# Patient Record
Sex: Male | Born: 1985 | Race: White | Hispanic: No | Marital: Single | Smoking: Current every day smoker
Health system: Southern US, Community
[De-identification: ages and names within clinical notes are randomized; demographics above are authoritative.]

## PROBLEM LIST (undated history)

## (undated) DIAGNOSIS — K4090 Unilateral inguinal hernia, without obstruction or gangrene, not specified as recurrent: Secondary | ICD-10-CM

## (undated) HISTORY — PX: OTHER SURGICAL HISTORY: SHX169

---

## 2010-10-02 ENCOUNTER — Emergency Department: Payer: Self-pay | Admitting: Emergency Medicine

## 2011-01-07 ENCOUNTER — Emergency Department: Payer: Self-pay | Admitting: Emergency Medicine

## 2011-05-17 ENCOUNTER — Emergency Department: Payer: Self-pay | Admitting: Unknown Physician Specialty

## 2011-10-24 ENCOUNTER — Emergency Department: Payer: Self-pay | Admitting: Emergency Medicine

## 2012-05-08 ENCOUNTER — Emergency Department: Payer: Self-pay | Admitting: Emergency Medicine

## 2013-06-24 ENCOUNTER — Emergency Department: Payer: Self-pay | Admitting: Emergency Medicine

## 2014-05-20 ENCOUNTER — Emergency Department: Payer: Self-pay | Admitting: Internal Medicine

## 2014-09-03 ENCOUNTER — Emergency Department: Payer: Self-pay | Admitting: Emergency Medicine

## 2014-10-18 ENCOUNTER — Emergency Department: Admit: 2014-10-18 | Disposition: A | Discharge status: Home / Self Care | Payer: Self-pay | Admitting: Student

## 2014-10-18 LAB — URINALYSIS, COMPLETE
BACTERIA: NONE SEEN
BILIRUBIN, UR: NEGATIVE
Blood: NEGATIVE
GLUCOSE, UR: NEGATIVE mg/dL (ref 0–75)
Ketone: NEGATIVE
Leukocyte Esterase: NEGATIVE
Nitrite: NEGATIVE
Ph: 7 (ref 4.5–8.0)
Protein: NEGATIVE
RBC,UR: NONE SEEN /HPF (ref 0–5)
SPECIFIC GRAVITY: 1.009 (ref 1.003–1.030)
Squamous Epithelial: NONE SEEN

## 2014-10-18 LAB — COMPREHENSIVE METABOLIC PANEL
ALBUMIN: 4.4 g/dL
ANION GAP: 7 (ref 7–16)
Alkaline Phosphatase: 67 U/L
BUN: 15 mg/dL
Bilirubin,Total: 0.4 mg/dL
CHLORIDE: 104 mmol/L
CO2: 26 mmol/L
Calcium, Total: 9.4 mg/dL
Creatinine: 0.92 mg/dL
EGFR (Non-African Amer.): >60
Glucose: 98 mg/dL
POTASSIUM: 3.7 mmol/L
SGOT(AST): 20 U/L
SGPT (ALT): 25 U/L
Sodium: 137 mmol/L
Total Protein: 7.4 g/dL

## 2014-10-18 LAB — CBC WITH DIFFERENTIAL/PLATELET
Basophil #: 0.1 10*3/uL (ref 0.0–0.1)
Basophil %: 1 %
EOS PCT: 2.2 %
Eosinophil #: 0.1 10*3/uL (ref 0.0–0.7)
HCT: 44.6 % (ref 40.0–52.0)
HGB: 15 g/dL (ref 13.0–18.0)
LYMPHS PCT: 24.6 %
Lymphocyte #: 1.5 10*3/uL (ref 1.0–3.6)
MCH: 29.3 pg (ref 26.0–34.0)
MCHC: 33.7 g/dL (ref 32.0–36.0)
MCV: 87 fL (ref 80–100)
MONOS PCT: 9.2 %
Monocyte #: 0.6 x10 3/mm (ref 0.2–1.0)
NEUTROS ABS: 3.8 10*3/uL (ref 1.4–6.5)
Neutrophil %: 63 %
Platelet: 278 10*3/uL (ref 150–440)
RBC: 5.12 10*6/uL (ref 4.40–5.90)
RDW: 13.8 % (ref 11.5–14.5)
WBC: 6.1 10*3/uL (ref 3.8–10.6)

## 2014-10-18 LAB — LIPASE, BLOOD: Lipase: 36 U/L

## 2015-10-23 ENCOUNTER — Emergency Department
Admission: EM | Admit: 2015-10-23 | Discharge: 2015-10-23 | Disposition: A | Discharge status: Home / Self Care | Payer: Self-pay | Attending: Emergency Medicine | Admitting: Emergency Medicine

## 2015-10-23 ENCOUNTER — Encounter: Payer: Self-pay | Admitting: Emergency Medicine

## 2015-10-23 DIAGNOSIS — F17210 Nicotine dependence, cigarettes, uncomplicated: Secondary | ICD-10-CM | POA: Insufficient documentation

## 2015-10-23 DIAGNOSIS — J040 Acute laryngitis: Secondary | ICD-10-CM | POA: Insufficient documentation

## 2015-10-23 DIAGNOSIS — J9801 Acute bronchospasm: Secondary | ICD-10-CM | POA: Insufficient documentation

## 2015-10-23 MED ORDER — BENZONATATE 100 MG PO CAPS: 100.0000 mg | ORAL_CAPSULE | Freq: Three times a day (TID) | ORAL | Status: DC | PRN

## 2015-10-23 MED ORDER — DEXAMETHASONE SODIUM PHOSPHATE 10 MG/ML IJ SOLN
10.0000 mg | Freq: Once | INTRAMUSCULAR | Status: AC
  Administered 2015-10-23: 10 mg via INTRAMUSCULAR
  Filled 2015-10-23: qty 1

## 2015-10-23 MED ORDER — FLUTICASONE PROPIONATE 50 MCG/ACT NA SUSP: 2.0000 | Freq: Every day | NASAL | Status: DC

## 2015-10-23 NOTE — ED Provider Notes (Signed)
[facility] Emergency Department Provider Note ____________________________________________  Time seen: 1615  I have reviewed the triage vital signs and the nursing notes.  HISTORY  Chief Complaint  Cough  HPI Patrick Zamora is a 30 y.o. male presents to the ED for evaluation of a 4 day complaint of dry, nonproductive cough. He also reports some weakness to his voice as well as raspiness. He denies outright fevers, chills, sweats. He has dosed over-the-counter Benadryl and an over-the-counter cough medicine with limited benefit. He reports some burning to the anterior chest related to the cough. He denies any other symptoms at this time.  History reviewed. No pertinent past medical history.  There are no active problems to display for this patient.  History reviewed. No pertinent past surgical history.  Current Outpatient Rx  Name  Route  Sig  Dispense  Refill  . benzonatate (TESSALON PERLES) 100 MG capsule   Oral   Take 1 capsule (100 mg total) by mouth 3 (three) times daily as needed for cough (Take 1-2 per dose).   30 capsule   0   . fluticasone (FLONASE) 50 MCG/ACT nasal spray   Each Nare   Place 2 sprays into both nostrils daily.   16 g   0    Allergies Review of patient's allergies indicates no known allergies.  No family history on file.  Social History Social History  Substance Use Topics  . Smoking status: Current Every Day Smoker -- 0.50 packs/day    Types: Cigarettes  . Smokeless tobacco: Never Used  . Alcohol Use: No   Review of Systems  Constitutional: Negative for fever. Eyes: Negative for visual changes. ENT: Negative for sore throat. Hoarseness as above Cardiovascular: Negative for chest pain. Respiratory: Negative for shortness of breath. Nonproductive cough as above Gastrointestinal: Negative for abdominal pain, vomiting and diarrhea. Musculoskeletal: Negative for back pain. Skin: Negative for rash. Neurological:  Negative for headaches, focal weakness or numbness. ____________________________________________  PHYSICAL EXAM:  VITAL SIGNS: ED Triage Vitals  Enc Vitals Group     BP 10/23/15 1523 135/91 mmHg     Pulse Rate 10/23/15 1523 81     Resp 10/23/15 1523 16     Temp 10/23/15 1523 97.7 F (36.5 C)     Temp Source 10/23/15 1523 Oral     SpO2 10/23/15 1523 96 %     Weight 10/23/15 1523 180 lb (81.647 kg)     Height 10/23/15 1523  (1.702 m)     Head Cir --      Peak Flow --      Pain Score 10/23/15 1524 0     Pain Loc --      Pain Edu? --      Excl. in GC? --    Constitutional: Alert and oriented. Well appearing and in no distress. Head: Normocephalic and atraumatic.      Eyes: Conjunctivae are normal. PERRL. Normal extraocular movements      Ears: Canals clear. TMs intact bilaterally.   Nose: No congestion/rhinorrhea. Dry nasal mucosa without epistaxis.   Mouth/Throat: Mucous membranes are moist. Uvula is midline and tonsils are flat.   Neck: Supple. No thyromegaly. Hematological/Lymphatic/Immunological: No cervical lymphadenopathy. Cardiovascular: Normal rate, regular rhythm.  Respiratory: Normal respiratory effort. No wheezes/rales/rhonchi. Musculoskeletal: Nontender with normal range of motion in all extremities.  Neurologic:  Normal gait without ataxia. Normal speech and language. No gross focal neurologic deficits are appreciated. Skin:  Skin is warm, dry and intact. No rash  noted. __________________________________________  PROCEDURES  Decadron 10 mg IM ____________________________________________  INITIAL IMPRESSION / ASSESSMENT AND PLAN / ED COURSE  Patient with an acute laryngitis and bronchospasm likely secondary to allergies. He'll be discharged with a prescription for Flonase and Tessalon Perles. He is encouraged to continue to dose over-the-counter allergy medicines as needed. Is also advised to utilize nasal saline to moisturize his sinuses. He'll  follow up with his primary care provider or [facility] as needed for ongoing symptoms. ____________________________________________  FINAL CLINICAL IMPRESSION(S) / ED DIAGNOSES  Final diagnoses:  Laryngitis, acute  Bronchospasm, acute      Lissa HoardJenise V Bacon Phillip Maffei, PA-C 10/23/15 1724  Arnaldo NatalPaul F Malinda, MD 10/24/15 [phone]

## 2015-10-23 NOTE — Discharge Instructions (Signed)
Bronchospasm, Adult  A bronchospasm is a spasm or tightening of the airways going into the lungs. During a bronchospasm breathing becomes more difficult because the airways get smaller. When this happens there can be coughing, a whistling sound when breathing (wheezing), and difficulty breathing. Bronchospasm is often associated with asthma, but not all patients who experience a bronchospasm have asthma.  CAUSES   A bronchospasm is caused by inflammation or irritation of the airways. The inflammation or irritation may be triggered by:   · Allergies (such as to animals, pollen, food, or mold). Allergens that cause bronchospasm may cause wheezing immediately after exposure or many hours later.    · Infection. Viral infections are believed to be the most common cause of bronchospasm.    · Exercise.    · Irritants (such as pollution, cigarette smoke, strong odors, aerosol sprays, and paint fumes).    · Weather changes. Winds increase molds and pollens in the air. Rain refreshes the air by washing irritants out. Cold air may cause inflammation.    · Stress and emotional upset.    SIGNS AND SYMPTOMS   · Wheezing.    · Excessive nighttime coughing.    · Frequent or severe coughing with a simple cold.    · Chest tightness.    · Shortness of breath.    DIAGNOSIS   Bronchospasm is usually diagnosed through a history and physical exam. Tests, such as chest X-rays, are sometimes done to look for other conditions.  TREATMENT   · Inhaled medicines can be given to open up your airways and help you breathe. The medicines can be given using either an inhaler or a nebulizer machine.  · Corticosteroid medicines may be given for severe bronchospasm, usually when it is associated with asthma.  HOME CARE INSTRUCTIONS   · Always have a plan prepared for seeking medical care. Know when to call your health care provider and local emergency services (911 in the [place] Know where you can access local emergency care.  · Only take medicines as  directed by your health care provider.  · If you were prescribed an inhaler or nebulizer machine, ask your health care provider to explain how to use it correctly. Always use a spacer with your inhaler if you were given one.  · It is necessary to remain calm during an attack. Try to relax and breathe more slowly.   · Control your home environment in the following ways:      Change your heating and air conditioning filter at least once a month.      Limit your use of fireplaces and wood stoves.    Do not smoke and do not allow smoking in your home.      Avoid exposure to perfumes and fragrances.      Get rid of pests (such as roaches and mice) and their droppings.      Throw away plants if you see mold on them.      Keep your house clean and dust free.      Replace carpet with wood, tile, or vinyl flooring. Carpet can trap dander and dust.      Use allergy-proof pillows, mattress covers, and box spring covers.      Wash bed sheets and blankets every week in hot water and dry them in a dryer.      Use blankets that are made of polyester or cotton.      Wash hands frequently.  SEEK MEDICAL CARE IF:   · You have muscle aches.    · You have chest pain.    · The sputum changes from clear or   even after taking your prescribed medicines.   You have increased difficulty breathing.   You develop severe chest pain. MAKE SURE YOU:   Understand these instructions.  Will watch your condition.  Will get help right away if you are not doing well or get worse.   This information is not intended to replace advice given to you by your health care provider. Make sure you discuss any questions you have with your health care  provider.   Document Released: 06/28/2003 Document Revised: 07/16/2014 Document Reviewed: 12/15/2012 Elsevier Interactive Patient Education 2016 Elsevier Inc.  Laryngitis Laryngitis is swelling (inflammation) of your vocal cords. This causes hoarseness, coughing, loss of voice, sore throat, or a dry throat. When your vocal cords are inflamed, your voice sounds different. Laryngitis can be temporary (acute) or long-term (chronic). Most cases of acute laryngitis improve with time. Chronic laryngitis is laryngitis that lasts for more than three weeks. HOME CARE  Drink enough fluid to keep your pee (urine) clear or pale yellow.  Breathe in moist air. Use a humidifier if you live in a dry climate.  Take medicines only as told by your doctor.  Do not smoke cigarettes or electronic cigarettes. If you need help quitting, ask your doctor.  Talk as little as possible. Also avoid whispering, which can cause vocal strain.  Write instead of talking. Do this until your voice is back to normal. GET HELP IF:  You have a fever.  Your pain is worse.  You have trouble swallowing. GET HELP RIGHT AWAY IF:  You cough up blood.  You have trouble breathing.   This information is not intended to replace advice given to you by your health care provider. Make sure you discuss any questions you have with your health care provider.   Document Released: 06/14/2011 Document Revised: 07/16/2014 Document Reviewed: 12/08/2013 Elsevier Interactive Patient Education 2016 [employer]  Take the prescription meds as directed. Start a daily allergy medicine (Allegra, Claritin, Zyrtec). Use nasal saline to moisturize the sinuses. [facility] for ongoing symptoms management.

## 2015-10-23 NOTE — ED Notes (Signed)
C/O cough x 4 days.  Non productive cough, symptoms worsening.

## 2015-10-24 ENCOUNTER — Emergency Department: Payer: Self-pay

## 2015-10-24 ENCOUNTER — Encounter: Payer: Self-pay | Admitting: Emergency Medicine

## 2015-10-24 ENCOUNTER — Emergency Department
Admission: EM | Admit: 2015-10-24 | Discharge: 2015-10-24 | Disposition: A | Discharge status: Home / Self Care | Payer: Self-pay | Attending: Emergency Medicine | Admitting: Emergency Medicine

## 2015-10-24 DIAGNOSIS — J40 Bronchitis, not specified as acute or chronic: Secondary | ICD-10-CM | POA: Insufficient documentation

## 2015-10-24 DIAGNOSIS — R55 Syncope and collapse: Secondary | ICD-10-CM | POA: Insufficient documentation

## 2015-10-24 DIAGNOSIS — F17210 Nicotine dependence, cigarettes, uncomplicated: Secondary | ICD-10-CM | POA: Insufficient documentation

## 2015-10-24 LAB — BASIC METABOLIC PANEL
Anion gap: 10 (ref 5–15)
BUN: 17 mg/dL (ref 6–20)
CALCIUM: 8.9 mg/dL (ref 8.9–10.3)
CHLORIDE: 106 mmol/L (ref 101–111)
CO2: 20 mmol/L — ABNORMAL LOW (ref 22–32)
CREATININE: 0.91 mg/dL (ref 0.61–1.24)
GFR calc Af Amer: >60 mL/min (ref >60)
GFR calc non Af Amer: >60 mL/min (ref >60)
Glucose, Bld: 137 mg/dL — ABNORMAL HIGH (ref 65–99)
Potassium: 3.6 mmol/L (ref 3.5–5.1)
SODIUM: 136 mmol/L (ref 135–145)

## 2015-10-24 LAB — CBC WITH DIFFERENTIAL/PLATELET
Basophils Absolute: 0.1 10*3/uL (ref 0–0.1)
Basophils Relative: 0 %
Eosinophils Absolute: 0.2 10*3/uL (ref 0–0.7)
Eosinophils Relative: 1 %
HCT: 38.9 % — ABNORMAL LOW (ref 40.0–52.0)
Hemoglobin: 13.4 g/dL (ref 13.0–18.0)
LYMPHS ABS: 1.1 10*3/uL (ref 1.0–3.6)
Lymphocytes Relative: 5 %
MCH: 30.3 pg (ref 26.0–34.0)
MCHC: 34.4 g/dL (ref 32.0–36.0)
MCV: 88.1 fL (ref 80.0–100.0)
MONO ABS: 1.4 10*3/uL — AB (ref 0.2–1.0)
Monocytes Relative: 6 %
Neutro Abs: 20.9 10*3/uL — ABNORMAL HIGH (ref 1.4–6.5)
Neutrophils Relative %: 88 %
Platelets: 270 10*3/uL (ref 150–440)
RBC: 4.41 MIL/uL (ref 4.40–5.90)
RDW: 13.5 % (ref 11.5–14.5)
WBC: 23.7 10*3/uL — ABNORMAL HIGH (ref 3.8–10.6)

## 2015-10-24 LAB — TROPONIN I

## 2015-10-24 LAB — FIBRIN DERIVATIVES D-DIMER (ARMC ONLY): Fibrin derivatives D-dimer (ARMC): 190 (ref 0–499)

## 2015-10-24 MED ORDER — HYDROCOD POLST-CPM POLST ER 10-8 MG/5ML PO SUER
5.0000 mL | Freq: Once | ORAL | Status: AC
  Administered 2015-10-24: 5 mL via ORAL
  Filled 2015-10-24: qty 5

## 2015-10-24 MED ORDER — GUAIFENESIN-CODEINE 100-10 MG/5ML PO SOLN: 5.0000 mL | Freq: Four times a day (QID) | ORAL | Status: DC | PRN

## 2015-10-24 MED ORDER — AZITHROMYCIN 250 MG PO TABS: ORAL_TABLET | ORAL | Status: AC

## 2015-10-24 NOTE — ED Provider Notes (Signed)
[facility] Emergency Department Provider Note  Time seen: 3:24 PM  I have reviewed the triage vital signs and the nursing notes.   HISTORY  Chief Complaint Gastroesophageal Reflux and Chest Pain    HPI Patrick Zamora is a 30 y.o. male with no past medical history presents to the emergency department with cough. Patient states for the past 4 days he has had a very frequent cough associated with mild anterior chest burning sensation. Patient was seen in the emergency department yesterday for the same and diagnosed with bronchitis prescribed Tessalon Perles and Flonase, patient states no relief.Patient states today he was having a coughing spell and had a near syncopal episode during the coughing spell so he came to the emergency department for evaluation. States the chest pain is only when he coughs, it is largely unchanged, however he does have mild chest pain with deep inspiration. Denies any leg pain or swelling. Denies any history of DVT. Describes the chest pain is mild burning sensation. Denies fever, nasal congestion, or history of seasonal allergies.     History reviewed. No pertinent past medical history.  There are no active problems to display for this patient.   History reviewed. No pertinent past surgical history.  Current Outpatient Rx  Name  Route  Sig  Dispense  Refill  . benzonatate (TESSALON PERLES) 100 MG capsule   Oral   Take 1 capsule (100 mg total) by mouth 3 (three) times daily as needed for cough (Take 1-2 per dose).   30 capsule   0   . fluticasone (FLONASE) 50 MCG/ACT nasal spray   Each Nare   Place 2 sprays into both nostrils daily.   16 g   0     Allergies Review of patient's allergies indicates no known allergies.  History reviewed. No pertinent family history.  Social History Social History  Substance Use Topics  . Smoking status: Current Every Day Smoker -- 0.50 packs/day    Types: Cigarettes  . Smokeless  tobacco: Never Used  . Alcohol Use: No    Review of Systems Constitutional: Negative for fever. Cardiovascular: Negative for chest pain. Respiratory: Negative for shortness of breath.  Positive for cough. Gastrointestinal: Negative for abdominal pain Musculoskeletal: Negative for back pain. Neurological: Negative for headache 10-point ROS otherwise negative.  ____________________________________________   PHYSICAL EXAM:  VITAL SIGNS: ED Triage Vitals  Enc Vitals Group     BP 10/24/15 1338 135/76 mmHg     Pulse Rate 10/24/15 1338 100     Resp 10/24/15 1338 18     Temp 10/24/15 1338 97.8 F (36.6 C)     Temp Source 10/24/15 1338 Oral     SpO2 10/24/15 1338 96 %     Weight 10/24/15 1338 180 lb (81.647 kg)     Height 10/24/15 1338 5\' 7"  (1.702 m)     Head Cir --      Peak Flow --      Pain Score 10/24/15 1340 9     Pain Loc --      Pain Edu? --      Excl. in GC? --    Constitutional: Alert and oriented. Well appearing and in no distress. Eyes: Normal exam ENT   Head: Normocephalic and atraumatic.   Mouth/Throat: Mucous membranes are moist. Cardiovascular: Normal rate, regular rhythm. No murmur Respiratory: Normal respiratory effort without tachypnea nor retractions. Breath sounds are clear and equal bilaterally. No wheezes/rales/rhonchi. Mild chest tenderness palpation. Gastrointestinal: Soft and nontender.  No distention. Musculoskeletal: Nontender with normal range of motion in all extremities. No lower extremity tenderness or edema. Neurologic:  Normal speech and language. No gross focal neurologic deficits Skin:  Skin is warm, dry and intact.  Psychiatric: Mood and affect are normal.   ____________________________________________    EKG  EKG reviewed and interpreted by myself shows normal sinus rhythm at 97 bpm, narrow QRS, normal axis, normal intervals, no ST changes. Normal EKG.  ____________________________________________    RADIOLOGY  Chest  x-ray shows no acute disease.   INITIAL IMPRESSION / ASSESSMENT AND PLAN / ED COURSE  Pertinent labs & imaging results that were available during my care of the patient were reviewed by me and considered in my medical decision making (see chart for details).  Patient presents with a near syncopal episode after a prolonged coughing spell. We will check labs including a d-dimer, troponin, and closely monitor in the emergency department. I will discuss Tussionex for cough. Patient denies any chest pain at this time. Denies any complaints at this time, resting comfortably in bed.  Labs are normal including d-dimer and troponin. Patient does have a moderate leukocytosis. Given his continued dry cough, we'll place the patient on codeine based cough medication as well as a Z-Pak. Patient agreeable to plan we'll follow up with his primary care doctor. I discussed return precautions which he is agreeable.  ____________________________________________   FINAL CLINICAL IMPRESSION(S) / ED DIAGNOSES  Bronchitis Near-syncope  Minna Antis, MD 10/24/15 [phone]

## 2015-10-24 NOTE — Discharge Instructions (Signed)
Near-Syncope Near-syncope (commonly known as near fainting) is sudden weakness, dizziness, or feeling like you might pass out. This can happen when getting up or while standing for a long time. It is caused by a sudden decrease in blood flow to the brain, which can occur for various reasons. Most of the reasons are not serious.  HOME CARE Watch your condition for any changes.  Have someone stay with you until you feel stable.  If you feel like you are going to pass out:  Lie down right away.  Prop your feet up if you can.  Breathe deeply and steadily.  Move only when the feeling has gone away. Most of the time, this feeling lasts only a few minutes. You may feel tired for several hours.  Drink enough fluids to keep your pee (urine) clear or pale yellow.  If you are taking blood pressure or heart medicine, stand up slowly.  Follow up with your doctor as told. GET HELP RIGHT AWAY IF:   You have a severe headache.  You have unusual pain in the chest, belly (abdomen), or back.  You have bleeding from the mouth or butt (rectum), or you have black or tarry poop (stool).  You feel your heart beat differently than normal, or you have a very fast pulse.  You pass out, or you twitch and shake when you pass out.  You pass out when sitting or lying down.  You feel confused.  You have trouble walking.  You are weak.  You have vision problems. MAKE SURE YOU:   Understand these instructions.  Will watch your condition.  Will get help right away if you are not doing well or get worse.   This information is not intended to replace advice given to you by your health care provider. Make sure you discuss any questions you have with your health care provider.   Document Released: 12/12/2007 Document Revised: 07/16/2014 Document Reviewed: 11/28/2012 Elsevier Interactive Patient Education 2016 Elsevier Inc.  Upper Respiratory Infection, Adult Most upper respiratory infections  (URIs) are caused by a virus. A URI affects the nose, throat, and upper air passages. The most common type of URI is often called "the common cold." HOME CARE   Take medicines only as told by your doctor.  Gargle warm saltwater or take cough drops to comfort your throat as told by your doctor.  Use a warm mist humidifier or inhale steam from a shower to increase air moisture. This may make it easier to breathe.  Drink enough fluid to keep your pee (urine) clear or pale yellow.  Eat soups and other clear broths.  Have a healthy diet.  Rest as needed.  Go back to work when your fever is gone or your doctor says it is okay.  You may need to stay home longer to avoid giving your URI to others.  You can also wear a face mask and wash your hands often to prevent spread of the virus.  Use your inhaler more if you have asthma.  Do not use any tobacco products, including cigarettes, chewing tobacco, or electronic cigarettes. If you need help quitting, ask your doctor. GET HELP IF:  You are getting worse, not better.  Your symptoms are not helped by medicine.  You have chills.  You are getting more short of breath.  You have brown or red mucus.  You have yellow or brown discharge from your nose.  You have pain in your face, especially when you bend  forward.  You have a fever.  You have puffy (swollen) neck glands.  You have pain while swallowing.  You have white areas in the back of your throat. GET HELP RIGHT AWAY IF:   You have very bad or constant:  Headache.  Ear pain.  Pain in your forehead, behind your eyes, and over your cheekbones (sinus pain).  Chest pain.  You have long-lasting (chronic) lung disease and any of the following:  Wheezing.  Long-lasting cough.  Coughing up blood.  A change in your usual mucus.  You have a stiff neck.  You have changes in your:  Vision.  Hearing.  Thinking.  Mood. MAKE SURE YOU:   Understand these  instructions.  Will watch your condition.  Will get help right away if you are not doing well or get worse.   This information is not intended to replace advice given to you by your health care provider. Make sure you discuss any questions you have with your health care provider.   Document Released: 12/12/2007 Document Revised: 11/09/2014 Document Reviewed: 09/30/2013 Elsevier Interactive Patient Education [employer] Elsevier Inc.

## 2015-10-24 NOTE — ED Notes (Addendum)
Pt presents to ED with c/o of burning in chest that started last night and has gotten worse throughout the day,"it is definitely worse after i eat". Pt states he coughed and his employee at work said he "blacked out ". Pt recently seen for cough.

## 2015-11-19 ENCOUNTER — Emergency Department: Payer: Self-pay

## 2015-11-19 ENCOUNTER — Encounter: Payer: Self-pay | Admitting: Emergency Medicine

## 2015-11-19 ENCOUNTER — Emergency Department
Admission: EM | Admit: 2015-11-19 | Discharge: 2015-11-19 | Disposition: A | Discharge status: Home / Self Care | Payer: Self-pay | Attending: Emergency Medicine | Admitting: Emergency Medicine

## 2015-11-19 DIAGNOSIS — R609 Edema, unspecified: Secondary | ICD-10-CM | POA: Insufficient documentation

## 2015-11-19 DIAGNOSIS — F17210 Nicotine dependence, cigarettes, uncomplicated: Secondary | ICD-10-CM | POA: Insufficient documentation

## 2015-11-19 DIAGNOSIS — K4090 Unilateral inguinal hernia, without obstruction or gangrene, not specified as recurrent: Secondary | ICD-10-CM | POA: Insufficient documentation

## 2015-11-19 DIAGNOSIS — Z7951 Long term (current) use of inhaled steroids: Secondary | ICD-10-CM | POA: Insufficient documentation

## 2015-11-19 LAB — COMPREHENSIVE METABOLIC PANEL
ALBUMIN: 4.7 g/dL (ref 3.5–5.0)
ALK PHOS: 64 U/L (ref 38–126)
ALT: 19 U/L (ref 17–63)
AST: 20 U/L (ref 15–41)
Anion gap: 10 (ref 5–15)
BILIRUBIN TOTAL: 0.7 mg/dL (ref 0.3–1.2)
BUN: 11 mg/dL (ref 6–20)
CALCIUM: 9.2 mg/dL (ref 8.9–10.3)
CO2: 24 mmol/L (ref 22–32)
Chloride: 102 mmol/L (ref 101–111)
Creatinine, Ser: 0.88 mg/dL (ref 0.61–1.24)
GFR calc Af Amer: >60 mL/min (ref >60)
GFR calc non Af Amer: >60 mL/min (ref >60)
GLUCOSE: 117 mg/dL — AB (ref 65–99)
Potassium: 3.8 mmol/L (ref 3.5–5.1)
SODIUM: 136 mmol/L (ref 135–145)
Total Protein: 7.4 g/dL (ref 6.5–8.1)

## 2015-11-19 LAB — CBC WITH DIFFERENTIAL/PLATELET
Basophils Absolute: 0.1 10*3/uL (ref 0–0.1)
Basophils Relative: 1 %
Eosinophils Absolute: 0.2 10*3/uL (ref 0–0.7)
Eosinophils Relative: 2 %
HCT: 41.9 % (ref 40.0–52.0)
HEMOGLOBIN: 14.8 g/dL (ref 13.0–18.0)
LYMPHS ABS: 2.3 10*3/uL (ref 1.0–3.6)
LYMPHS PCT: 28 %
MCH: 30.3 pg (ref 26.0–34.0)
MCHC: 35.2 g/dL (ref 32.0–36.0)
MCV: 86 fL (ref 80.0–100.0)
Monocytes Absolute: 0.7 10*3/uL (ref 0.2–1.0)
Monocytes Relative: 8 %
Neutro Abs: 5 10*3/uL (ref 1.4–6.5)
Neutrophils Relative %: 61 %
Platelets: 293 10*3/uL (ref 150–440)
RBC: 4.87 MIL/uL (ref 4.40–5.90)
RDW: 13.4 % (ref 11.5–14.5)
WBC: 8.2 10*3/uL (ref 3.8–10.6)

## 2015-11-19 MED ORDER — TRAMADOL HCL 50 MG PO TABS
50.0000 mg | ORAL_TABLET | Freq: Once | ORAL | Status: AC
  Administered 2015-11-19: 50 mg via ORAL
  Filled 2015-11-19: qty 1

## 2015-11-19 MED ORDER — OXYCODONE-ACETAMINOPHEN 7.5-325 MG PO TABS: 1.0000 | ORAL_TABLET | ORAL | Status: DC | PRN

## 2015-11-19 MED ORDER — OXYCODONE-ACETAMINOPHEN 5-325 MG PO TABS
1.0000 | ORAL_TABLET | Freq: Once | ORAL | Status: AC
  Administered 2015-11-19: 1 via ORAL
  Filled 2015-11-19: qty 1

## 2015-11-19 MED ORDER — IBUPROFEN 800 MG PO TABS
800.0000 mg | ORAL_TABLET | Freq: Once | ORAL | Status: AC
  Administered 2015-11-19: 800 mg via ORAL
  Filled 2015-11-19: qty 1

## 2015-11-19 NOTE — Discharge Instructions (Signed)
Inguinal Hernia, Adult , Care After °Refer to this sheet in the next few weeks. These discharge instructions provide you with general information on caring for yourself after you leave the hospital. Your caregiver may also give you specific instructions. Your treatment has been planned according to the most current medical practices available, but unavoidable complications sometimes occur. If you have any problems or questions after discharge, please call your caregiver. °HOME CARE INSTRUCTIONS °· Put ice on the operative site. °¨ Put ice in a plastic bag. °¨ Place a towel between your skin and the bag. °¨ Leave the ice on for 15-20 minutes at a time, 03-04 times a day while awake. °· Change bandages (dressings) as directed. °· Keep the wound dry and clean. The wound may be washed gently with soap and water. Gently blot or dab the wound dry. It is okay to take showers 24 to 48 hours after surgery. Do not take baths, use swimming pools, or use hot tubs for 10 days, or as directed by your caregiver. °· Only take over-the-counter or prescription medicines for pain, discomfort, or fever as directed by your caregiver. °· Continue your normal diet as directed. °· Do not lift anything more than 10 pounds or play contact sports for 3 weeks, or as directed. °SEEK MEDICAL CARE IF: °· There is redness, swelling, or increasing pain in the wound. °· There is fluid (pus) coming from the wound. °· There is drainage from a wound lasting longer than 1 day. °· You have an oral temperature above 102° F (38.9° C). °· You notice a bad smell coming from the wound or dressing. °· The wound breaks open after the stitches (sutures) have been removed. °· You notice increasing pain in the shoulders (shoulder strap areas). °· You develop dizzy episodes or fainting while standing. °· You feel sick to your stomach (nauseous) or throw up (vomit). °SEEK IMMEDIATE MEDICAL CARE IF: °· You develop a rash. °· You have difficulty breathing. °· You  develop a reaction or have side effects to medicines you were given. °MAKE SURE YOU:  °· Understand these instructions. °· Will watch your condition. °· Will get help right away if you are not doing well or get worse. °  °This information is not intended to replace advice given to you by your health care provider. Make sure you discuss any questions you have with your health care provider. °  °Document Released: 07/26/2006 Document Revised: 07/16/2014 Document Reviewed: 12/27/2014 °Elsevier Interactive Patient Education ©2016 Elsevier Inc. ° °

## 2015-11-19 NOTE — ED Notes (Signed)
Intermittent R groin pain and swelling x 4 days.. States is not always bulging.

## 2015-11-19 NOTE — ED Notes (Signed)
md notified of pain level.

## 2015-11-19 NOTE — ED Provider Notes (Signed)
[facility] Emergency Department Provider Note   ____________________________________________  Time seen: Approximately 6:27 PM  I have reviewed the triage vital signs and the nursing notes.   HISTORY  Chief Complaint Groin Pain    HPI Patrick Zamora is a 30 y.o. male patient came intermitting right groin pain radiating to the right scrotum for 4 days. Patient stated there is committing palpable mass in the right inguinal area. Patient states increased pain and  inguinal mass with bowel movements and squatting. Patient denies any particular provocative incident. Patient states his job requires a lot of lifting and squatting. Patient rates his pain discomfort as a 9/10.Palliative measures for this complaint. Patient is gravida pain is intermittently "sharp". No palliative measures for this complaint.   History reviewed. No pertinent past medical history.  There are no active problems to display for this patient.   Past Surgical History  Procedure Laterality Date  . Undescended testicle      Current Outpatient Rx  Name  Route  Sig  Dispense  Refill  . benzonatate (TESSALON PERLES) 100 MG capsule   Oral   Take 1 capsule (100 mg total) by mouth 3 (three) times daily as needed for cough (Take 1-2 per dose).   30 capsule   0   . fluticasone (FLONASE) 50 MCG/ACT nasal spray   Each Nare   Place 2 sprays into both nostrils daily.   16 g   0   . guaiFENesin-codeine 100-10 MG/5ML syrup   Oral   Take 5 mLs by mouth every 6 (six) hours as needed for cough.   120 mL   0     Allergies Review of patient's allergies indicates no known allergies.  No family history on file.  Social History Social History  Substance Use Topics  . Smoking status: Current Every Day Smoker -- 0.50 packs/day    Types: Cigarettes  . Smokeless tobacco: Never Used  . Alcohol Use: No    Review of Systems Constitutional: No fever/chills Eyes: No visual  changes. ENT: No sore throat. Cardiovascular: Denies chest pain. Respiratory: Denies shortness of breath. Gastrointestinal: No abdominal pain.  No nausea, no vomiting.  No diarrhea.  No constipation. Genitourinary: Right inguinal and scrotal pain. Musculoskeletal: Negative for back pain. Skin: Negative for rash. Neurological: Negative for headaches, focal weakness or numbness.  ____________________________________________   PHYSICAL EXAM:  VITAL SIGNS: ED Triage Vitals  Enc Vitals Group     BP 11/19/15 1737 135/88 mmHg     Pulse Rate 11/19/15 1737 83     Resp 11/19/15 1737 18     Temp 11/19/15 1737 97.8 F (36.6 C)     Temp Source 11/19/15 1737 Oral     SpO2 11/19/15 1737 96 %     Weight 11/19/15 1737 180 lb (81.647 kg)     Height 11/19/15 1737 5\' 7"  (1.702 m)     Head Cir --      Peak Flow --      Pain Score 11/19/15 1738 9     Pain Loc --      Pain Edu? --      Excl. in GC? --     Constitutional: Alert and oriented. Well appearing and in no acute distress. Eyes: Conjunctivae are normal. PERRL. EOMI. Head: Atraumatic. Nose: No congestion/rhinnorhea. Mouth/Throat: Mucous membranes are moist.  Oropharynx non-erythematous. Neck: No stridor.  No cervical spine tenderness to palpation. Hematological/Lymphatic/Immunilogical: No cervical lymphadenopathy. Cardiovascular: Normal rate, regular rhythm. Grossly normal heart  sounds.  Good peripheral circulation. Respiratory: Normal respiratory effort.  No retractions. Lungs CTAB. Gastrointestinal: Soft and nontender. No distention. No abdominal bruits. No CVA tenderness. Genitourinary: No obvious inguinal or scrotal masses this time. Patient is tender palpation right inguinal area and this. Aspect of the right scrotum. Musculoskeletal: No lower extremity tenderness nor edema.  No joint effusions. Neurologic:  Normal speech and language. No gross focal neurologic deficits are appreciated. No gait instability. Skin:  Skin is warm,  dry and intact. No rash noted. Psychiatric: Mood and affect are normal. Speech and behavior are normal.  ____________________________________________   LABS (all labs ordered are listed, but only abnormal results are displayed)  Labs Reviewed  COMPREHENSIVE METABOLIC PANEL - Abnormal; Notable for the following:    Glucose, Bld 117 (*)    All other components within normal limits  CBC WITH DIFFERENTIAL/PLATELET   ____________________________________________  EKG   ____________________________________________  RADIOLOGY  Ultrasound findings consistent right inguinal hernia. ____________________________________________   PROCEDURES  Procedure(s) performed: None  Critical Care performed: No  ____________________________________________   INITIAL IMPRESSION / ASSESSMENT AND PLAN / ED COURSE  Pertinent labs & imaging results that were available during my care of the patient were reviewed by me and considered in my medical decision making (see chart for details).  Right inguinal hernia. Patient given discharge care instructions. Patient advised to follow up telephonically with surgical clinic in 2 days. Patient given a work note. ____________________________________________   FINAL CLINICAL IMPRESSION(S) / ED DIAGNOSES  Final diagnoses:  Edema  Right inguinal hernia      NEW MEDICATIONS STARTED DURING THIS VISIT:  New Prescriptions   No medications on file     Note:  This document was prepared using Dragon voice recognition software and may include unintentional dictation errors.    Joni Reining, PA-C 11/19/15 2033  Jennye Moccasin, MD 11/19/15 [phone]

## 2015-11-19 NOTE — ED Notes (Signed)
Pt verbalizes understanding that he is not to drive for 8 hours after percocet administration by noel, rn. Pt states "my wife is in the parking lot and she is driving me home."

## 2015-11-26 ENCOUNTER — Emergency Department
Admission: EM | Admit: 2015-11-26 | Discharge: 2015-11-26 | Disposition: A | Discharge status: Home / Self Care | Payer: Self-pay | Attending: Emergency Medicine | Admitting: Emergency Medicine

## 2015-11-26 ENCOUNTER — Encounter: Payer: Self-pay | Admitting: Emergency Medicine

## 2015-11-26 DIAGNOSIS — Z7951 Long term (current) use of inhaled steroids: Secondary | ICD-10-CM | POA: Insufficient documentation

## 2015-11-26 DIAGNOSIS — K4091 Unilateral inguinal hernia, without obstruction or gangrene, recurrent: Secondary | ICD-10-CM | POA: Insufficient documentation

## 2015-11-26 DIAGNOSIS — F17210 Nicotine dependence, cigarettes, uncomplicated: Secondary | ICD-10-CM | POA: Insufficient documentation

## 2015-11-26 MED ORDER — ACETAMINOPHEN 325 MG PO TABS
650.0000 mg | ORAL_TABLET | Freq: Once | ORAL | Status: AC
  Administered 2015-11-26: 650 mg via ORAL
  Filled 2015-11-26: qty 2

## 2015-11-26 NOTE — ED Provider Notes (Signed)
[facility] Emergency Department Provider Note   ____________________________________________  Time seen: ~2040  I have reviewed the triage vital signs and the nursing notes.   HISTORY  Chief Complaint Abdominal Pain   History limited by: Not Limited   HPI Patrick Zamora is a 30 y.o. male who presents to the emergency department today because of concern for continued pain at his right inguinal hernia. The patient was seen initially at [facility] roughly 2 weeks ago and diagnosed with a right inguinal hernia. He was subsequently seen in this emergency department and again diagnosed with a right inguinal hernia. He was given a surgery follow-up at his last ER visit. He states that he has an appointment with them in 6 days. He states he denies that the pain on and off. He does state that he feels like it comes out when he ends down or does heavy lifting. Denies any vomiting. Normal bowel movements today. No fevers.   History reviewed. No pertinent past medical history.  There are no active problems to display for this patient.   Past Surgical History  Procedure Laterality Date  . Undescended testicle      Current Outpatient Rx  Name  Route  Sig  Dispense  Refill  . benzonatate (TESSALON PERLES) 100 MG capsule   Oral   Take 1 capsule (100 mg total) by mouth 3 (three) times daily as needed for cough (Take 1-2 per dose).   30 capsule   0   . fluticasone (FLONASE) 50 MCG/ACT nasal spray   Each Nare   Place 2 sprays into both nostrils daily.   16 g   0   . guaiFENesin-codeine 100-10 MG/5ML syrup   Oral   Take 5 mLs by mouth every 6 (six) hours as needed for cough.   120 mL   0   . oxyCODONE-acetaminophen (PERCOCET) 7.5-325 MG tablet   Oral   Take 1 tablet by mouth every 4 (four) hours as needed for severe pain.   20 tablet   0     Allergies Review of patient's allergies indicates no known allergies.  No family history on file.  Social  History Social History  Substance Use Topics  . Smoking status: Current Every Day Smoker -- 0.50 packs/day    Types: Cigarettes  . Smokeless tobacco: Never Used  . Alcohol Use: No    Review of Systems  Constitutional: Negative for fever. Cardiovascular: Negative for chest pain. Respiratory: Negative for shortness of breath. Gastrointestinal: Positive for right pelvic pain. Neurological: Negative for headaches, focal weakness or numbness.  10-point ROS otherwise negative.  ____________________________________________   PHYSICAL EXAM:  VITAL SIGNS: ED Triage Vitals  Enc Vitals Group     BP 11/26/15 1603 141/92 mmHg     Pulse Rate 11/26/15 1603 83     Resp 11/26/15 1603 20     Temp 11/26/15 1603 97.5 F (36.4 C)     Temp Source 11/26/15 1603 Oral     SpO2 11/26/15 1603 99 %     Weight 11/26/15 1603 180 lb (81.647 kg)     Height 11/26/15 1603  (1.676 m)     Head Cir --      Peak Flow --      Pain Score 11/26/15 1605 10   Constitutional: Alert and oriented. Well appearing and in no distress. Eyes: Conjunctivae are normal. PERRL. Normal extraocular movements. ENT   Head: Normocephalic and atraumatic.   Nose: No congestion/rhinnorhea.   Mouth/Throat:  Mucous membranes are moist.   Neck: No stridor. Hematological/Lymphatic/Immunilogical: No cervical lymphadenopathy. Cardiovascular: Normal rate, regular rhythm.  No murmurs, rubs, or gallops. Respiratory: Normal respiratory effort without tachypnea nor retractions. Breath sounds are clear and equal bilaterally. No wheezes/rales/rhonchi. Gastrointestinal: Soft and nontender. No distention. No obvious hernia at this time. No skin changes.  Genitourinary: Deferred Musculoskeletal: Normal range of motion in all extremities. No joint effusions.  No lower extremity tenderness nor edema. Neurologic:  Normal speech and language. No gross focal neurologic deficits are appreciated.  Skin:  Skin is warm, dry and  intact. No rash noted. Psychiatric: Mood and affect are normal. Speech and behavior are normal. Patient exhibits appropriate insight and judgment.  ____________________________________________    LABS (pertinent positives/negatives)  None  ____________________________________________   EKG  None  ____________________________________________    RADIOLOGY  None  ____________________________________________   PROCEDURES  Procedure(s) performed: None  Critical Care performed: No  ____________________________________________   INITIAL IMPRESSION / ASSESSMENT AND PLAN / ED COURSE  Pertinent labs & imaging results that were available during my care of the patient were reviewed by me and considered in my medical decision making (see chart for details).  Patient presented to the emergency department today because of concerns for continued right inguinal pain. Patient has a known right inguinal hernia. He does have a surgery appointment scheduled in 6 days. The patient does not have any signs or symptoms concerning for strangulation at this point. Didn't encourage patient that he follow up with surgery. Will give patient a note to prevent heavy lifting.  ____________________________________________   FINAL CLINICAL IMPRESSION(S) / ED DIAGNOSES  Final diagnoses:  Unilateral recurrent inguinal hernia without obstruction or gangrene     Note: This dictation was prepared with Dragon dictation. Any transcriptional errors that result from this process are unintentional    Phineas SemenGraydon Vielka Klinedinst, MD 11/26/15 2120

## 2015-11-26 NOTE — ED Notes (Signed)
Gave pt work note.

## 2015-11-26 NOTE — Discharge Instructions (Signed)
Please seek medical attention for any high fevers, chest pain, shortness of breath, change in behavior, persistent vomiting, bloody stool or any other new or concerning symptoms.   Inguinal Hernia, Adult Muscles help keep everything in the body in its proper place. But if a weak spot in the muscles develops, something can poke through. That is called a hernia. When this happens in the lower part of the belly (abdomen), it is called an inguinal hernia. (It takes its name from a part of the body in this region called the inguinal canal.) A weak spot in the wall of muscles lets some fat or part of the small intestine bulge through. An inguinal hernia can develop at any age. Men get them more often than women. CAUSES  In adults, an inguinal hernia develops over time.  It can be triggered by:  Suddenly straining the muscles of the lower abdomen.  Lifting heavy objects.  Straining to have a bowel movement. Difficult bowel movements (constipation) can lead to this.  Constant coughing. This may be caused by smoking or lung disease.  Being overweight.  Being pregnant.  Working at a job that requires long periods of standing or heavy lifting.  Having had an inguinal hernia before. One type can be an emergency situation. It is called a strangulated inguinal hernia. It develops if part of the small intestine slips through the weak spot and cannot get back into the abdomen. The blood supply can be cut off. If that happens, part of the intestine may die. This situation requires emergency surgery. SYMPTOMS  Often, a small inguinal hernia has no symptoms. It is found when a healthcare provider does a physical exam. Larger hernias usually have symptoms.   In adults, symptoms may include:  A lump in the groin. This is easier to see when the person is standing. It might disappear when lying down.  In men, a lump in the scrotum.  Pain or burning in the groin. This occurs especially when lifting,  straining or coughing.  A dull ache or feeling of pressure in the groin.  Signs of a strangulated hernia can include:  A bulge in the groin that becomes very painful and tender to the touch.  A bulge that turns red or purple.  Fever, nausea and vomiting.  Inability to have a bowel movement or to pass gas. DIAGNOSIS  To decide if you have an inguinal hernia, a healthcare provider will probably do a physical examination.  This will include asking questions about any symptoms you have noticed.  The healthcare provider might feel the groin area and ask you to cough. If an inguinal hernia is felt, the healthcare provider may try to slide it back into the abdomen.  Usually no other tests are needed. TREATMENT  Treatments can vary. The size of the hernia makes a difference. Options include:  Watchful waiting. This is often suggested if the hernia is small and you have had no symptoms.  No medical procedure will be done unless symptoms develop.  You will need to watch closely for symptoms. If any occur, contact your healthcare provider right away.  Surgery. This is used if the hernia is larger or you have symptoms.  Open surgery. This is usually an outpatient procedure (you will not stay overnight in a hospital). An cut (incision) is made through the skin in the groin. The hernia is put back inside the abdomen. The weak area in the muscles is then repaired by herniorrhaphy or hernioplasty. Herniorrhaphy: in  this type of surgery, the weak muscles are sewn back together. Hernioplasty: a patch or mesh is used to close the weak area in the abdominal wall.  Laparoscopy. In this procedure, a surgeon makes small incisions. A thin tube with a tiny video camera (called a laparoscope) is put into the abdomen. The surgeon repairs the hernia with mesh by looking with the video camera and using two long instruments. HOME CARE INSTRUCTIONS   After surgery to repair an inguinal hernia:  You will need  to take pain medicine prescribed by your healthcare provider. Follow all directions carefully.  You will need to take care of the wound from the incision.  Your activity will be restricted for awhile. This will probably include no heavy lifting for several weeks. You also should not do anything too active for a few weeks. When you can return to work will depend on the type of job that you have.  During "watchful waiting" periods, you should:  Maintain a healthy weight.  Eat a diet high in fiber (fruits, vegetables and whole grains).  Drink plenty of fluids to avoid constipation. This means drinking enough water and other liquids to keep your urine clear or pale yellow.  Do not lift heavy objects.  Do not stand for long periods of time.  Quit smoking. This should keep you from developing a frequent cough. SEEK MEDICAL CARE IF:   A bulge develops in your groin area.  You feel pain, a burning sensation or pressure in the groin. This might be worse if you are lifting or straining.  You develop a fever of more than 100.5 F (38.1 C). SEEK IMMEDIATE MEDICAL CARE IF:   Pain in the groin increases suddenly.  A bulge in the groin gets bigger suddenly and does not go down.  For men, there is sudden pain in the scrotum. Or, the size of the scrotum increases.  A bulge in the groin area becomes red or purple and is painful to touch.  You have nausea or vomiting that does not go away.  You feel your heart beating much faster than normal.  You cannot have a bowel movement or pass gas.  You develop a fever of more than 102.0 F (38.9 C).   This information is not intended to replace advice given to you by your health care provider. Make sure you discuss any questions you have with your health care provider.   Document Released: 11/11/2008 Document Revised: 09/17/2011 Document Reviewed: 12/27/2014 Elsevier Interactive Patient Education [employer]

## 2015-11-26 NOTE — ED Notes (Signed)
Reviewed d/c instructions with pt. Pt verbalized understanding.

## 2015-11-26 NOTE — ED Notes (Signed)
States lower R abdominal pain. Seen here previously and diagnosed with hernia with referal to surgeon. States has continued to work during that time. States bulge continues intermittent.

## 2015-11-30 ENCOUNTER — Other Ambulatory Visit: Payer: Self-pay

## 2015-11-30 DIAGNOSIS — Z720 Tobacco use: Secondary | ICD-10-CM | POA: Insufficient documentation

## 2015-12-02 ENCOUNTER — Telehealth: Payer: Self-pay

## 2015-12-02 ENCOUNTER — Encounter (INDEPENDENT_AMBULATORY_CARE_PROVIDER_SITE_OTHER): Payer: Self-pay

## 2015-12-02 ENCOUNTER — Other Ambulatory Visit
Admission: RE | Admit: 2015-12-02 | Discharge: 2015-12-02 | Disposition: A | Discharge status: Home / Self Care | Payer: Self-pay | Source: Ambulatory Visit | Attending: Surgery | Admitting: Surgery

## 2015-12-02 ENCOUNTER — Ambulatory Visit (INDEPENDENT_AMBULATORY_CARE_PROVIDER_SITE_OTHER): Payer: Self-pay | Admitting: Surgery

## 2015-12-02 ENCOUNTER — Encounter: Payer: Self-pay | Admitting: Surgery

## 2015-12-02 VITALS — BP 148/95 | HR 76 | Temp 97.9°F | Ht 66.0 in | Wt 191.0 lb

## 2015-12-02 DIAGNOSIS — R1031 Right lower quadrant pain: Secondary | ICD-10-CM | POA: Insufficient documentation

## 2015-12-02 DIAGNOSIS — G8929 Other chronic pain: Secondary | ICD-10-CM

## 2015-12-02 DIAGNOSIS — N451 Epididymitis: Secondary | ICD-10-CM

## 2015-12-02 DIAGNOSIS — K4090 Unilateral inguinal hernia, without obstruction or gangrene, not specified as recurrent: Secondary | ICD-10-CM

## 2015-12-02 LAB — URINALYSIS COMPLETE WITH MICROSCOPIC (ARMC ONLY)
Bacteria, UA: NONE SEEN
Bilirubin Urine: NEGATIVE
Glucose, UA: NEGATIVE mg/dL
HGB URINE DIPSTICK: NEGATIVE
KETONES UR: NEGATIVE mg/dL
NITRITE: NEGATIVE
PH: 7 (ref 5.0–8.0)
PROTEIN: NEGATIVE mg/dL
SPECIFIC GRAVITY, URINE: 1.013 (ref 1.005–1.030)

## 2015-12-02 MED ORDER — SULFAMETHOXAZOLE-TRIMETHOPRIM 800-160 MG PO TABS: 1.0000 | ORAL_TABLET | Freq: Two times a day (BID) | ORAL | Status: DC

## 2015-12-02 MED ORDER — SULFAMETHOXAZOLE-TRIMETHOPRIM 800-160 MG PO TABS
1.0000 | ORAL_TABLET | Freq: Two times a day (BID) | ORAL | Status: DC
Start: 2015-12-02 — End: 2018-01-20

## 2015-12-02 NOTE — Progress Notes (Signed)
Subjective:     Patient ID: Patrick Zamora, male   DOB: 1985-09-01, 30 y.o.   MRN: [id number]  HPI 29 yr old male who states history of a past few weeks of right groin pain. Patient states that in the morning and will be okay but that about the course of the day however increasing pain in his right groin and down into his testicle. Patient states that over the past week or so has been increasingly worse in the right testicle as well. Patient states these had difficulty starting urination as well as some burning or hot or feeling whenever he urinates. Patient denies any abnormal discharge from his penis or any change in sexual habits. Patient does state that it causes pain with ejaculation as well.  Patient has also had some pain in the right groin area with straining and he has had some constipation as well which she has not been using anything for this time. Patient denies any fever chills nausea vomiting abdominal pain or diarrhea.  Past Medical History:  Undescended Testicle, Smoking use   Past Surgical History  Procedure Laterality Date  . Undescended testicle     Family History: denies any Heart problems, diabetes or cancers in his family, he state grandfather had heart attack when he was older   Social History   Social History  . Marital Status: Single    Spouse Name: N/A  . Number of Children: N/A  . Years of Education: N/A   Social History Main Topics  . Smoking status: Current Every Day Smoker -- 0.50 packs/day    Types: Cigarettes  . Smokeless tobacco: Never Used  . Alcohol Use: No  . Drug Use: No  . Sexual Activity: Not Asked   Other Topics Concern  . None   Social History Narrative    Current outpatient prescriptions:  .  ibuprofen (ADVIL,MOTRIN) 600 MG tablet, Take 600 mg by mouth., Disp: , Rfl:  .  albuterol (PROVENTIL HFA;VENTOLIN HFA) 108 (90 Base) MCG/ACT inhaler, Inhale into the lungs. Reported on 12/02/2015, Disp: , Rfl:  .  benzonatate (TESSALON PERLES)  100 MG capsule, Take 1 capsule (100 mg total) by mouth 3 (three) times daily as needed for cough (Take 1-2 per dose). (Patient not taking: Reported on 12/02/2015), Disp: 30 capsule, Rfl: 0 .  cyclobenzaprine (FLEXERIL) 10 MG tablet, Take 10 mg by mouth. Reported on 12/02/2015, Disp: , Rfl:  .  fluticasone (FLONASE) 50 MCG/ACT nasal spray, Place 2 sprays into both nostrils daily. (Patient not taking: Reported on 12/02/2015), Disp: 16 g, Rfl: 0 .  guaiFENesin-codeine 100-10 MG/5ML syrup, Take 5 mLs by mouth every 6 (six) hours as needed for cough. (Patient not taking: Reported on 12/02/2015), Disp: 120 mL, Rfl: 0 .  naproxen (NAPROSYN) 500 MG tablet, Take 1 tablet by mouth daily. Reported on 12/02/2015, Disp: , Rfl:  .  oxyCODONE-acetaminophen (PERCOCET) 7.5-325 MG tablet, Take 1 tablet by mouth every 4 (four) hours as needed for severe pain. (Patient not taking: Reported on 12/02/2015), Disp: 20 tablet, Rfl: 0 .  sulfamethoxazole-trimethoprim (BACTRIM DS,SEPTRA DS) 800-160 MG tablet, Take 1 tablet by mouth 2 (two) times daily., Disp: 28 tablet, Rfl: 0 No Known Allergies   Review of Systems  Constitutional: Positive for appetite change and fatigue. Negative for fever, chills, activity change and unexpected weight change.  HENT: Negative for congestion and sore throat.   Respiratory: Negative for cough, chest tightness, shortness of breath and wheezing.   Cardiovascular: Negative for chest pain, palpitations  and leg swelling.  Gastrointestinal: Positive for nausea and constipation. Negative for vomiting, abdominal pain, diarrhea, blood in stool and abdominal distention.  Genitourinary: Positive for dysuria, urgency, frequency, scrotal swelling, difficulty urinating and testicular pain. Negative for hematuria, flank pain, decreased urine volume, discharge, penile swelling, genital sores and penile pain.  Musculoskeletal: Negative for arthralgias and neck pain.  Skin: Negative for color change, pallor, rash  and wound.  Neurological: Negative for dizziness and weakness.  Hematological: Negative for adenopathy. Does not bruise/bleed easily.  Psychiatric/Behavioral: Negative for agitation. The patient is not nervous/anxious.   All other systems reviewed and are negative.      Filed Vitals:   12/02/15 1025  BP: 148/95  Pulse: 76  Temp: 97.9 F (36.6 C)    Objective:   Physical Exam  Constitutional: He is oriented to person, place, and time. He appears well-developed and well-nourished. No distress.  HENT:  Head: Normocephalic and atraumatic.  Right Ear: External ear normal.  Left Ear: External ear normal.  Nose: Nose normal.  Mouth/Throat: Oropharynx is clear and moist. No oropharyngeal exudate.  Eyes: Conjunctivae and EOM are normal. Pupils are equal, round, and reactive to light. No scleral icterus.  Neck: Normal range of motion. Neck supple. No tracheal deviation present.  Cardiovascular: Normal rate, regular rhythm, normal heart sounds and intact distal pulses.  Exam reveals no gallop and no friction rub.   No murmur heard. Pulmonary/Chest: Effort normal and breath sounds normal. No respiratory distress. He has no wheezes. He has no rales.  Abdominal: Soft. Bowel sounds are normal. He exhibits no distension. There is no tenderness. There is no rebound and no guarding.  Genitourinary: Rectum normal and penis normal.  Right inguinal area with hernia palpable with coughing/straining.  Right scrotum swollen with tenderness along cord, exquisite tenderness along epidiymis with some enlargement as well, tenderness to right testicle as well  Left groin: no hernia palpable, scrotum and testicle normal  Musculoskeletal: Normal range of motion. He exhibits no edema or tenderness.  Neurological: He is alert and oriented to person, place, and time. No cranial nerve deficit.  Skin: Skin is warm and dry. No rash noted. No erythema. No pallor.  Psychiatric: He has a normal mood and affect. His  behavior is normal. Judgment and thought content normal.  Vitals reviewed.      Assessment:     30yr old male with right epididymitis and right inguinal hernia     Plan:     I discussed with the patient that he has epididymitis. I also called and discussed this with Dr. Apolinar JunesBrandon of urology as well. I will start him on Bactrim DS for a period of 2 weeks as well as get a UA and culture to ensure that we are treating appropriately. If the patient is not feeling better in 12 days he should call to [facility] and get an appointment for follow-up with them. Otherwise I'll have the patient follow-up with me in 3 weeks to further discuss his right inguinal hernia repair. I did discuss with the patient that there were 2 different options but that both would need placement of mesh in order to get the best repair which could not be done while he had an active infection. The patient was given opportunity to ask questions and have them answered is in agreement with this plan.

## 2015-12-02 NOTE — Telephone Encounter (Signed)
I spoke with patient to let him know UA was showing bacteria. He was instructed to continue with antibiotic and i would call him with the UA culture results on Tuesday.

## 2015-12-02 NOTE — Patient Instructions (Addendum)
If you are not feeling any better after taking your antibiotic please call [facility] and make an appointment. Please see your appointment listed below. Please call if you have any questions or concerns.

## 2015-12-03 LAB — URINE CULTURE: CULTURE: NO GROWTH

## 2015-12-06 NOTE — Telephone Encounter (Signed)
Called patient to let him know the Urine culture showed no growth.

## 2016-01-04 ENCOUNTER — Ambulatory Visit: Payer: Self-pay | Admitting: Surgery

## 2016-08-21 ENCOUNTER — Emergency Department
Admission: EM | Admit: 2016-08-21 | Discharge: 2016-08-21 | Disposition: A | Discharge status: Home / Self Care | Payer: Self-pay | Attending: Emergency Medicine | Admitting: Emergency Medicine

## 2016-08-21 ENCOUNTER — Encounter: Payer: Self-pay | Admitting: Emergency Medicine

## 2016-08-21 DIAGNOSIS — K0889 Other specified disorders of teeth and supporting structures: Secondary | ICD-10-CM | POA: Insufficient documentation

## 2016-08-21 DIAGNOSIS — Z79899 Other long term (current) drug therapy: Secondary | ICD-10-CM | POA: Insufficient documentation

## 2016-08-21 DIAGNOSIS — Z791 Long term (current) use of non-steroidal anti-inflammatories (NSAID): Secondary | ICD-10-CM | POA: Insufficient documentation

## 2016-08-21 DIAGNOSIS — Z87891 Personal history of nicotine dependence: Secondary | ICD-10-CM | POA: Insufficient documentation

## 2016-08-21 MED ORDER — OXYCODONE-ACETAMINOPHEN 5-325 MG PO TABS: 1.0000 | ORAL_TABLET | Freq: Four times a day (QID) | ORAL | 0 refills | Status: DC | PRN

## 2016-08-21 MED ORDER — PENICILLIN V POTASSIUM 250 MG PO TABS
500.0000 mg | ORAL_TABLET | Freq: Once | ORAL | Status: AC
  Administered 2016-08-21: 500 mg via ORAL
  Filled 2016-08-21: qty 2

## 2016-08-21 MED ORDER — OXYCODONE-ACETAMINOPHEN 5-325 MG PO TABS
1.0000 | ORAL_TABLET | Freq: Once | ORAL | Status: AC
  Administered 2016-08-21: 1 via ORAL
  Filled 2016-08-21: qty 1

## 2016-08-21 MED ORDER — PENICILLIN V POTASSIUM 500 MG PO TABS: 500.0000 mg | ORAL_TABLET | Freq: Four times a day (QID) | ORAL | 0 refills | Status: DC

## 2016-08-21 MED ORDER — LORAZEPAM 2 MG/ML IJ SOLN
INTRAMUSCULAR | Status: AC
  Filled 2016-08-21: qty 1

## 2016-08-21 NOTE — ED Provider Notes (Signed)
[facility] Emergency Department Provider Note  ____________________________________________   First MD Initiated Contact with Patient 08/21/16 [phone]     (approximate)  I have reviewed the triage vital signs and the nursing notes.   HISTORY  Chief Complaint Dental Pain   HPI Patrick Zamora is a 31 y.o. male who is presenting emergency department with 2 days of worsening right lower dental pain. He says that he has had worsening dental pain for a month but that over the past 2 days he has been unable to sleep. He has not had dental care in some time. Stopped smoking about 2-1/2 months ago. Does not report any fever or facial swelling.Patient has been using BC's as well as ibuprofen without relief.   History reviewed. No pertinent past medical history.  Patient Active Problem List   Diagnosis Date Noted  . Epididymitis, right 12/02/2015  . Inguinal hernia, right 12/02/2015  . Current tobacco use 11/30/2015    Past Surgical History:  Procedure Laterality Date  . undescended testicle      Prior to Admission medications   Medication Sig Start Date End Date Taking? Authorizing Provider  albuterol (PROVENTIL HFA;VENTOLIN HFA) 108 (90 Base) MCG/ACT inhaler Inhale into the lungs. Reported on 12/02/2015 05/29/15 05/28/16  Historical Provider, MD  benzonatate (TESSALON PERLES) 100 MG capsule Take 1 capsule (100 mg total) by mouth 3 (three) times daily as needed for cough (Take 1-2 per dose). Patient not taking: Reported on 12/02/2015 10/23/15   Charlesetta IvoryJenise V Bacon Menshew, PA-C  cyclobenzaprine (FLEXERIL) 10 MG tablet Take 10 mg by mouth. Reported on 12/02/2015 04/06/15   Historical Provider, MD  fluticasone (FLONASE) 50 MCG/ACT nasal spray Place 2 sprays into both nostrils daily. Patient not taking: Reported on 12/02/2015 10/23/15   Smith RobertJenise V Bacon Menshew, PA-C  guaiFENesin-codeine 100-10 MG/5ML syrup Take 5 mLs by mouth every 6 (six) hours as needed for  cough. Patient not taking: Reported on 12/02/2015 10/24/15   Minna AntisKevin Paduchowski, MD  ibuprofen (ADVIL,MOTRIN) 600 MG tablet Take 600 mg by mouth. 04/06/15   Historical Provider, MD  naproxen (NAPROSYN) 500 MG tablet Take 1 tablet by mouth daily. Reported on 12/02/2015 01/20/15   Historical Provider, MD  oxyCODONE-acetaminophen (PERCOCET) 7.5-325 MG tablet Take 1 tablet by mouth every 4 (four) hours as needed for severe pain. Patient not taking: Reported on 12/02/2015 11/19/15   Joni Reiningonald K Smith, PA-C  sulfamethoxazole-trimethoprim (BACTRIM DS,SEPTRA DS) 800-160 MG tablet Take 1 tablet by mouth 2 (two) times daily. 12/02/15   Gladis Riffleatherine L Loflin, MD    Allergies Patient has no known allergies.  No family history on file.  Social History Social History  Substance Use Topics  . Smoking status: Former Smoker    Packs/day: 0.50    Types: Cigarettes  . Smokeless tobacco: Never Used  . Alcohol use No    Review of Systems Constitutional: No fever/chills Eyes: No visual changes. ENT: No sore throat. Cardiovascular: Denies chest pain. Respiratory: Denies shortness of breath. Gastrointestinal: No abdominal pain.  No nausea, no vomiting.  No diarrhea.  No constipation. Genitourinary: Negative for dysuria. Musculoskeletal: Negative for back pain. Skin: Negative for rash. Neurological: Negative for headaches, focal weakness or numbness.  10-point ROS otherwise negative.  ____________________________________________   PHYSICAL EXAM:  VITAL SIGNS: ED Triage Vitals  Enc Vitals Group     BP 08/21/16 0330 135/81     Pulse Rate 08/21/16 0330 70     Resp 08/21/16 0330 18     Temp  08/21/16 0330 98 F (36.7 C)     Temp Source 08/21/16 0330 Oral     SpO2 08/21/16 0330 100 %     Weight 08/21/16 0330 190 lb (86.2 kg)     Height 08/21/16 0330 5\' 7"  (1.702 m)     Head Circumference --      Peak Flow --      Pain Score 08/21/16 0331 8     Pain Loc --      Pain Edu? --      Excl. in GC? --      Constitutional: Alert and oriented. Well appearing and in no acute distress. Eyes: Conjunctivae are normal. PERRL. EOMI. Head: Atraumatic. Nose: No congestion/rhinnorhea. Mouth/Throat: Mucous membranes are moist.  Poor dentition with erosion especially to the posterior molars both on the mandibular as well as maxillary rows.  Patient points to teeth 29, 30 and 31 which has severe erosion around the gumline. No swelling or fluctuance on gross examination of the mandibles on palpation to the mucosa. Also with erosion to the other posterior molars on the mandible as well as the maxilla however without worsening pain at this time per the patient. Neck: No stridor.   Cardiovascular: Normal rate, regular rhythm. Grossly normal heart sounds.   Respiratory: Normal respiratory effort.  No retractions. Lungs CTAB. Gastrointestinal: Soft and nontender. No distention.  Musculoskeletal: No lower extremity tenderness nor edema.  No joint effusions. Neurologic:  Normal speech and language. No gross focal neurologic deficits are appreciated. No gait instability. Skin:  Skin is warm, dry and intact. No rash noted. Psychiatric: Mood and affect are normal. Speech and behavior are normal.  ____________________________________________   LABS (all labs ordered are listed, but only abnormal results are displayed)  Labs Reviewed - No data to display ____________________________________________  EKG   ____________________________________________  RADIOLOGY   ____________________________________________   PROCEDURES  Procedure(s) performed:   Procedures  Critical Care performed:   ____________________________________________   INITIAL IMPRESSION / ASSESSMENT AND PLAN / ED COURSE  Pertinent labs & imaging results that were available during my care of the patient were reviewed by me and considered in my medical decision making (see chart for  details).  ----------------------------------------- 5:18 AM on 08/21/2016 -----------------------------------------  Patient will be given penicillin as well as Percocet as well as a list of dental clinics for follow-up. He says that he plans to call for an appointment, ideally later today.      ____________________________________________   FINAL CLINICAL IMPRESSION(S) / ED DIAGNOSES  Dental pain.     NEW MEDICATIONS STARTED DURING THIS VISIT:  New Prescriptions   No medications on file     Note:  This document was prepared using Dragon voice recognition software and may include unintentional dictation errors.    Myrna Blazer, MD 08/21/16 [phone]

## 2016-08-21 NOTE — ED Triage Notes (Signed)
Patient ambulatory to triage with steady gait, without difficulty or distress noted; pt reports right lower dental pain x 2 days 

## 2017-05-25 DIAGNOSIS — Z87891 Personal history of nicotine dependence: Secondary | ICD-10-CM | POA: Insufficient documentation

## 2017-05-25 DIAGNOSIS — Y908 Blood alcohol level of 240 mg/100 ml or more: Secondary | ICD-10-CM | POA: Insufficient documentation

## 2017-05-25 DIAGNOSIS — Z046 Encounter for general psychiatric examination, requested by authority: Secondary | ICD-10-CM | POA: Insufficient documentation

## 2017-05-25 DIAGNOSIS — Z79899 Other long term (current) drug therapy: Secondary | ICD-10-CM | POA: Insufficient documentation

## 2017-05-25 DIAGNOSIS — F10220 Alcohol dependence with intoxication, uncomplicated: Secondary | ICD-10-CM | POA: Insufficient documentation

## 2017-05-25 LAB — COMPREHENSIVE METABOLIC PANEL
ALK PHOS: 87 U/L (ref 38–126)
ALT: 20 U/L (ref 17–63)
ANION GAP: 12 (ref 5–15)
AST: 21 U/L (ref 15–41)
Albumin: 5 g/dL (ref 3.5–5.0)
BUN: 14 mg/dL (ref 6–20)
CALCIUM: 9 mg/dL (ref 8.9–10.3)
CHLORIDE: 106 mmol/L (ref 101–111)
CO2: 23 mmol/L (ref 22–32)
Creatinine, Ser: 0.94 mg/dL (ref 0.61–1.24)
GFR calc non Af Amer: >60 mL/min (ref >60)
Glucose, Bld: 108 mg/dL — ABNORMAL HIGH (ref 65–99)
Potassium: 4.4 mmol/L (ref 3.5–5.1)
SODIUM: 141 mmol/L (ref 135–145)
Total Bilirubin: 0.5 mg/dL (ref 0.3–1.2)
Total Protein: 8.1 g/dL (ref 6.5–8.1)

## 2017-05-25 LAB — CBC
HCT: 48.4 % (ref 40.0–52.0)
Hemoglobin: 16.5 g/dL (ref 13.0–18.0)
MCH: 30.7 pg (ref 26.0–34.0)
MCHC: 34.1 g/dL (ref 32.0–36.0)
MCV: 89.9 fL (ref 80.0–100.0)
PLATELETS: 345 10*3/uL (ref 150–440)
RBC: 5.38 MIL/uL (ref 4.40–5.90)
RDW: 14 % (ref 11.5–14.5)
WBC: 8.9 10*3/uL (ref 3.8–10.6)

## 2017-05-25 LAB — ETHANOL: Alcohol, Ethyl (B): 310 mg/dL (ref <10)

## 2017-05-25 MED ORDER — SODIUM CHLORIDE 0.9 % IV BOLUS (SEPSIS)
1000.0000 mL | Freq: Once | INTRAVENOUS | Status: AC
  Administered 2017-05-25: 1000 mL via INTRAVENOUS

## 2017-05-25 NOTE — ED Notes (Signed)
Date and time results received: 05/25/17 23:07 (use smartphrase ".now" to insert current time)  Test: ethanol Critical Value: 310  Name of Provider Notified: Dr. Dolores FrameSung  Orders Received? Or Actions Taken?: Acknowledged

## 2017-05-25 NOTE — ED Notes (Addendum)
Pt here with Deputy Rice for medical clearance for jail; breathalyzer 0.31 and has to be lower than 0.30 for jail; deputy says handcuffs cannot come off; he also believes that if pt can get medically cleared by MD the jail would take him with that alcohol level

## 2017-05-25 NOTE — ED Triage Notes (Signed)
[facility] here in custody [facility] for medical clearance.

## 2017-05-26 ENCOUNTER — Emergency Department
Admission: EM | Admit: 2017-05-26 | Discharge: 2017-05-26 | Payer: Self-pay | Attending: Emergency Medicine | Admitting: Emergency Medicine

## 2017-05-26 DIAGNOSIS — F10920 Alcohol use, unspecified with intoxication, uncomplicated: Secondary | ICD-10-CM

## 2017-05-26 DIAGNOSIS — Z008 Encounter for other general examination: Secondary | ICD-10-CM

## 2017-05-26 HISTORY — DX: Unilateral inguinal hernia, without obstruction or gangrene, not specified as recurrent: K40.90

## 2017-05-26 LAB — ETHANOL: ALCOHOL ETHYL (B): 263 mg/dL — AB (ref <10)

## 2017-05-26 NOTE — ED Notes (Signed)
Pt brought in to ED by [employer] [facility] for medical clearance to go to jail. Pt has now been evaluated by Dr Dolores FrameSung and is ready for discharge. Pt denies injury or complaints at this time.

## 2017-05-26 NOTE — Discharge Instructions (Signed)
Return to the ER for worsening symptoms, persistent vomiting, difficulty breathing, lethargy or other concerns 

## 2017-05-26 NOTE — ED Provider Notes (Signed)
[facility] Emergency Department Provider Note   ____________________________________________   First MD Initiated Contact with Patient 05/26/17 0031     (approximate)  I have reviewed the triage vital signs and the nursing notes.   HISTORY  Chief Complaint Medical Clearance    HPI Patrick Zamora is a 31 y.o. male [facility].  They are requesting patient's blood alcohol level be lower than 300.  He blew a 310 on his breathalyzer and his initial serum EtOH was 310.  Patient voices no medical complaints.  Denies SI/HI/AH/VH.  Denies recent trauma.   Past Medical History:  Diagnosis Date  . Inguinal hernia     Patient Active Problem List   Diagnosis Date Noted  . Epididymitis, right 12/02/2015  . Inguinal hernia, right 12/02/2015  . Current tobacco use 11/30/2015    Past Surgical History:  Procedure Laterality Date  . undescended testicle      Prior to Admission medications   Medication Sig Start Date End Date Taking? Authorizing Provider  albuterol (PROVENTIL HFA;VENTOLIN HFA) 108 (90 Base) MCG/ACT inhaler Inhale into the lungs. Reported on 12/02/2015 05/29/15 05/28/16  [provider]  benzonatate (TESSALON PERLES) 100 MG capsule Take 1 capsule (100 mg total) by mouth 3 (three) times daily as needed for cough (Take 1-2 per dose). Patient not taking: Reported on 12/02/2015 10/23/15   Menshew, Charlesetta IvoryJenise V Bacon, PA-C  cyclobenzaprine (FLEXERIL) 10 MG tablet Take 10 mg by mouth. Reported on 12/02/2015 04/06/15   [provider]  fluticasone (FLONASE) 50 MCG/ACT nasal spray Place 2 sprays into both nostrils daily. Patient not taking: Reported on 12/02/2015 10/23/15   Menshew, Charlesetta IvoryJenise V Bacon, PA-C  guaiFENesin-codeine 100-10 MG/5ML syrup Take 5 mLs by mouth every 6 (six) hours as needed for cough. Patient not taking: Reported on 12/02/2015 10/24/15   Minna AntisPaduchowski, Kevin, MD    ibuprofen (ADVIL,MOTRIN) 600 MG tablet Take 600 mg by mouth. 04/06/15   [provider]  naproxen (NAPROSYN) 500 MG tablet Take 1 tablet by mouth daily. Reported on 12/02/2015 01/20/15   [provider]  oxyCODONE-acetaminophen (ROXICET) 5-325 MG tablet Take 1-2 tablets by mouth every 6 (six) hours as needed. 08/21/16   Schaevitz, Myra Rudeavid Matthew, MD  penicillin v potassium (VEETID) 500 MG tablet Take 1 tablet (500 mg total) by mouth 4 (four) times daily. 08/21/16   Schaevitz, Myra Rudeavid Matthew, MD  sulfamethoxazole-trimethoprim (BACTRIM DS,SEPTRA DS) 800-160 MG tablet Take 1 tablet by mouth 2 (two) times daily. 12/02/15   Gladis RiffleLoflin, Catherine L, MD    Allergies Patient has no known allergies.  No family history on file.  Social History Social History   Tobacco Use  . Smoking status: Former Smoker    Packs/day: 0.50    Types: Cigarettes  . Smokeless tobacco: Never Used  Substance Use Topics  . Alcohol use: No  . Drug use: No    Review of Systems  Constitutional: No fever/chills. Eyes: No visual changes. ENT: No sore throat. Cardiovascular: Denies chest pain. Respiratory: Denies shortness of breath. Gastrointestinal: No abdominal pain.  No nausea, no vomiting.  No diarrhea.  No constipation. Genitourinary: Negative for dysuria. Musculoskeletal: Negative for back pain. Skin: Negative for rash. Neurological: Negative for headaches, focal weakness or numbness.   ____________________________________________   PHYSICAL EXAM:  VITAL SIGNS: ED Triage Vitals  Enc Vitals Group     BP 05/25/17 2223 132/83     Pulse Rate 05/25/17 2223 Marland Kitchen(!)  103     Resp 05/25/17 2223 18     Temp 05/25/17 2223 98.3 F (36.8 C)     Temp Source 05/25/17 2223 Oral     SpO2 05/25/17 2223 96 %     Weight 05/25/17 2221 180 lb (81.6 kg)     Height 05/25/17 2221 5\' 7"  (1.702 m)     Head Circumference --      Peak Flow --      Pain Score 05/25/17 2221 6     Pain Loc --      Pain Edu? --       Excl. in GC? --     Constitutional: Alert and oriented. Well appearing and in no acute distress.  Mildly intoxicated. Eyes: Conjunctivae are bloodshot bilaterally. PERRL. EOMI. Head: Atraumatic. Nose: No congestion/rhinnorhea. Mouth/Throat: Mucous membranes are moist.  Oropharynx non-erythematous. Neck: No stridor.  No cervical spine tenderness to palpation. Cardiovascular: Normal rate, regular rhythm. Grossly normal heart sounds.  Good peripheral circulation. Respiratory: Normal respiratory effort.  No retractions. Lungs CTAB. Gastrointestinal: Soft and nontender. No distention. No abdominal bruits. No CVA tenderness. Musculoskeletal: No lower extremity tenderness nor edema.  No joint effusions. Neurologic:  Normal speech and language. No gross focal neurologic deficits are appreciated. No gait instability.  Ambulated to hallway bed without difficulty. Skin:  Skin is warm, dry and intact. No rash noted. Psychiatric: Mood and affect are normal. Speech and behavior are normal.  ____________________________________________   LABS (all labs ordered are listed, but only abnormal results are displayed)  Labs Reviewed  COMPREHENSIVE METABOLIC PANEL - Abnormal; Notable for the following components:      Result Value   Glucose, Bld 108 (*)    All other components within normal limits  ETHANOL - Abnormal; Notable for the following components:   Alcohol, Ethyl (B) 310 (*)    All other components within normal limits  ETHANOL - Abnormal; Notable for the following components:   Alcohol, Ethyl (B) 263 (*)    All other components within normal limits  CBC  URINE DRUG SCREEN, QUALITATIVE ([facility] ONLY)   ____________________________________________  EKG  None ____________________________________________  RADIOLOGY  No results found.  ____________________________________________   PROCEDURES  Procedure(s) performed: None  Procedures  Critical Care performed:  No  ____________________________________________   INITIAL IMPRESSION / ASSESSMENT AND PLAN / ED COURSE  As part of my medical decision making, I reviewed the following data within the electronic MEDICAL RECORD NUMBER Nursing notes reviewed and incorporated, Labs reviewed  and Notes from prior ED visits.   31 year old male brought for medical clearance for jail.  After 2 L IV fluids his repeat EtOH is 263.  He is ambulatory with steady gait and will be discharged to jail with officers.  Strict return precautions given.  Patient verbalizes understanding and agrees with plan of care.      ____________________________________________   FINAL CLINICAL IMPRESSION(S) / ED DIAGNOSES  Final diagnoses:  Alcoholic intoxication without complication (HCC)  Medical clearance for incarceration     ED Discharge Orders    None       Note:  This document was prepared using Dragon voice recognition software and may include unintentional dictation errors.    Irean HongSung, Lashana Spang J, MD 05/26/17 [phone]

## 2017-05-26 NOTE — ED Notes (Signed)
Pt ambulatory to hallway bed, then ambulatory to toilet

## 2018-01-20 ENCOUNTER — Emergency Department: Payer: Self-pay

## 2018-01-20 ENCOUNTER — Other Ambulatory Visit: Payer: Self-pay

## 2018-01-20 ENCOUNTER — Emergency Department
Admission: EM | Admit: 2018-01-20 | Discharge: 2018-01-20 | Disposition: A | Discharge status: Home / Self Care | Payer: Self-pay | Attending: Emergency Medicine | Admitting: Emergency Medicine

## 2018-01-20 ENCOUNTER — Encounter: Payer: Self-pay | Admitting: Emergency Medicine

## 2018-01-20 DIAGNOSIS — J209 Acute bronchitis, unspecified: Secondary | ICD-10-CM | POA: Insufficient documentation

## 2018-01-20 DIAGNOSIS — Z87891 Personal history of nicotine dependence: Secondary | ICD-10-CM | POA: Insufficient documentation

## 2018-01-20 MED ORDER — DEXAMETHASONE SODIUM PHOSPHATE 10 MG/ML IJ SOLN
10.0000 mg | Freq: Once | INTRAMUSCULAR | Status: AC
  Administered 2018-01-20: 10 mg via INTRAMUSCULAR
  Filled 2018-01-20: qty 1

## 2018-01-20 MED ORDER — AZITHROMYCIN 250 MG PO TABS: ORAL_TABLET | ORAL | 0 refills | Status: DC

## 2018-01-20 MED ORDER — PREDNISONE 20 MG PO TABS: 20.0000 mg | ORAL_TABLET | Freq: Every day | ORAL | 0 refills | Status: AC

## 2018-01-20 MED ORDER — IPRATROPIUM-ALBUTEROL 0.5-2.5 (3) MG/3ML IN SOLN
3.0000 mL | Freq: Once | RESPIRATORY_TRACT | Status: AC
  Administered 2018-01-20: 3 mL via RESPIRATORY_TRACT
  Filled 2018-01-20: qty 3

## 2018-01-20 MED ORDER — BENZONATATE 100 MG PO CAPS: ORAL_CAPSULE | ORAL | 0 refills | Status: DC

## 2018-01-20 NOTE — Discharge Instructions (Signed)
Your exam and x-ray are consistent with a bronchitis. Take the prescription meds as directed. Follow[facility]up with [facility] for ongoing symptoms.

## 2018-01-20 NOTE — ED Triage Notes (Signed)
Says he thinks he has pneumonia or bronchitis.

## 2018-01-20 NOTE — ED Triage Notes (Signed)
Cough x 1 week productive phlegm.

## 2018-01-20 NOTE — ED Notes (Signed)
See triage note  Presents with prod cough  States cough started about 1 week ago

## 2018-01-23 NOTE — ED Provider Notes (Signed)
[facility] Emergency Department Provider Note ____________________________________________  Time seen: 1515  I have reviewed the triage vital signs and the nursing notes.  HISTORY  Chief Complaint  Cough  HPI Patrick Zamora is a 32 y.o. male presents to the ED accompanied by his wife, for evaluation of an intermittently productive cough. The patient denies fevers, but notes greenish phlegm with deep coughs. He also reports burning to the central chest with deep breathing. He is reporting runny nose and shortness of breath. He denies any cough-induced vomiting, hemoptysis, or chest pain. He is concerned about pneumonia or bronchitis.   Past Medical History:  Diagnosis Date  . Inguinal hernia     Patient Active Problem List   Diagnosis Date Noted  . Epididymitis, right 12/02/2015  . Inguinal hernia, right 12/02/2015  . Current tobacco use 11/30/2015    Past Surgical History:  Procedure Laterality Date  . undescended testicle      Prior to Admission medications   Medication Sig Start Date End Date Taking? Authorizing Provider  albuterol (PROVENTIL HFA;VENTOLIN HFA) 108 (90 Base) MCG/ACT inhaler Inhale into the lungs. Reported on 12/02/2015 05/29/15 05/28/16  [provider]  azithromycin (ZITHROMAX Z-PAK) 250 MG tablet Take 2 tablets (500 mg) on  Day 1,  followed by 1 tablet (250 mg) once daily on Days 2 through 5. 01/20/18   Kaavya Puskarich, Charlesetta Ivory, PA-C  benzonatate (TESSALON PERLES) 100 MG capsule Take 1-2 tabs TID prn cough 01/20/18   Nickolaos Brallier, Charlesetta Ivory, PA-C  predniSONE (DELTASONE) 20 MG tablet Take 1 tablet (20 mg total) by mouth daily with breakfast for 5 days. 01/20/18 01/25/18  Jerriyah Louis, Charlesetta Ivory, PA-C    Allergies Patient has no known allergies.  No family history on file.  Social History Social History   Tobacco Use  . Smoking status: Former Smoker    Packs/day: 0.50    Types: Cigarettes  . Smokeless tobacco: Never  Used  Substance Use Topics  . Alcohol use: No  . Drug use: No    Review of Systems  Constitutional: Negative for fever. Eyes: Negative for visual changes. ENT: Negative for sore throat. Cardiovascular: Negative for chest pain. Respiratory: Positive for shortness of breath and cough. Gastrointestinal: Negative for abdominal pain, vomiting and diarrhea. Musculoskeletal: Negative for back pain. Skin: Negative for rash. Neurological: Negative for headaches, focal weakness or numbness. ____________________________________________  PHYSICAL EXAM:  VITAL SIGNS: ED Triage Vitals [01/20/18 1432]  Enc Vitals Group     BP (!) 141/87     Pulse Rate 65     Resp 18     Temp 98.2 F (36.8 C)     Temp Source Oral     SpO2 96 %     Weight 180 lb (81.6 kg)     Height 5\' 7"  (1.702 m)     Head Circumference      Peak Flow      Pain Score 8     Pain Loc      Pain Edu?      Excl. in [place]?     Constitutional: Alert and oriented. Well appearing and in no distress. Head: Normocephalic and atraumatic. Eyes: Conjunctivae are normal. Normal extraocular movements Ears: Canals clear. TMs intact bilaterally. Nose: No congestion/rhinorrhea/epistaxis. Mouth/Throat: Mucous membranes are moist. Neck: Supple. No thyromegaly. Hematological/Lymphatic/Immunological: No cervical lymphadenopathy. Cardiovascular: Normal rate, regular rhythm. Normal distal pulses. Respiratory: Normal respiratory effort. Scattered wheezes & moderate rhonchi noted bilaterally Gastrointestinal: Soft and nontender. No distention.  Musculoskeletal: Nontender with normal range of motion in all extremities.  Neurologic:  Normal gait without ataxia. Normal speech and language. No gross focal neurologic deficits are appreciated. Skin:  Skin is warm, dry and intact. No rash noted. ____________________________________________   RADIOLOGY CXR  IMPRESSION: No edema or  consolidation. __________________________________________  PROCEDURES  Procedures DuoNeb x 1 Decadron 10 mg IM ____________________________________________  INITIAL IMPRESSION / ASSESSMENT AND PLAN / ED COURSE  Patient with ED evaluation of an intermittently productive cough. He is reassured by his negative CXR. He will be treated for a bronchitis. He is discharged with azithromycin, albuterol, tessalon perles, and prednisone. He will follow-up with a local community clinic or return for worsening symptoms. ____________________________________________  FINAL CLINICAL IMPRESSION(S) / ED DIAGNOSES  Final diagnoses:  Acute bronchitis, unspecified organism     Lissa HoardMenshew, Alycia Cooperwood V Bacon, PA-C 01/23/18 1620    Don PerkingVeronese, [place], MD 01/23/18 2226

## 2018-02-08 IMAGING — CR DG CHEST 2V
2 series · 3 of 3 positions shown · non-contrast
Comparison: 01/07/2011

CLINICAL DATA: Cough, central chest pain, shortness of breath

EXAM:
CHEST  2 VIEW

[chest pa]
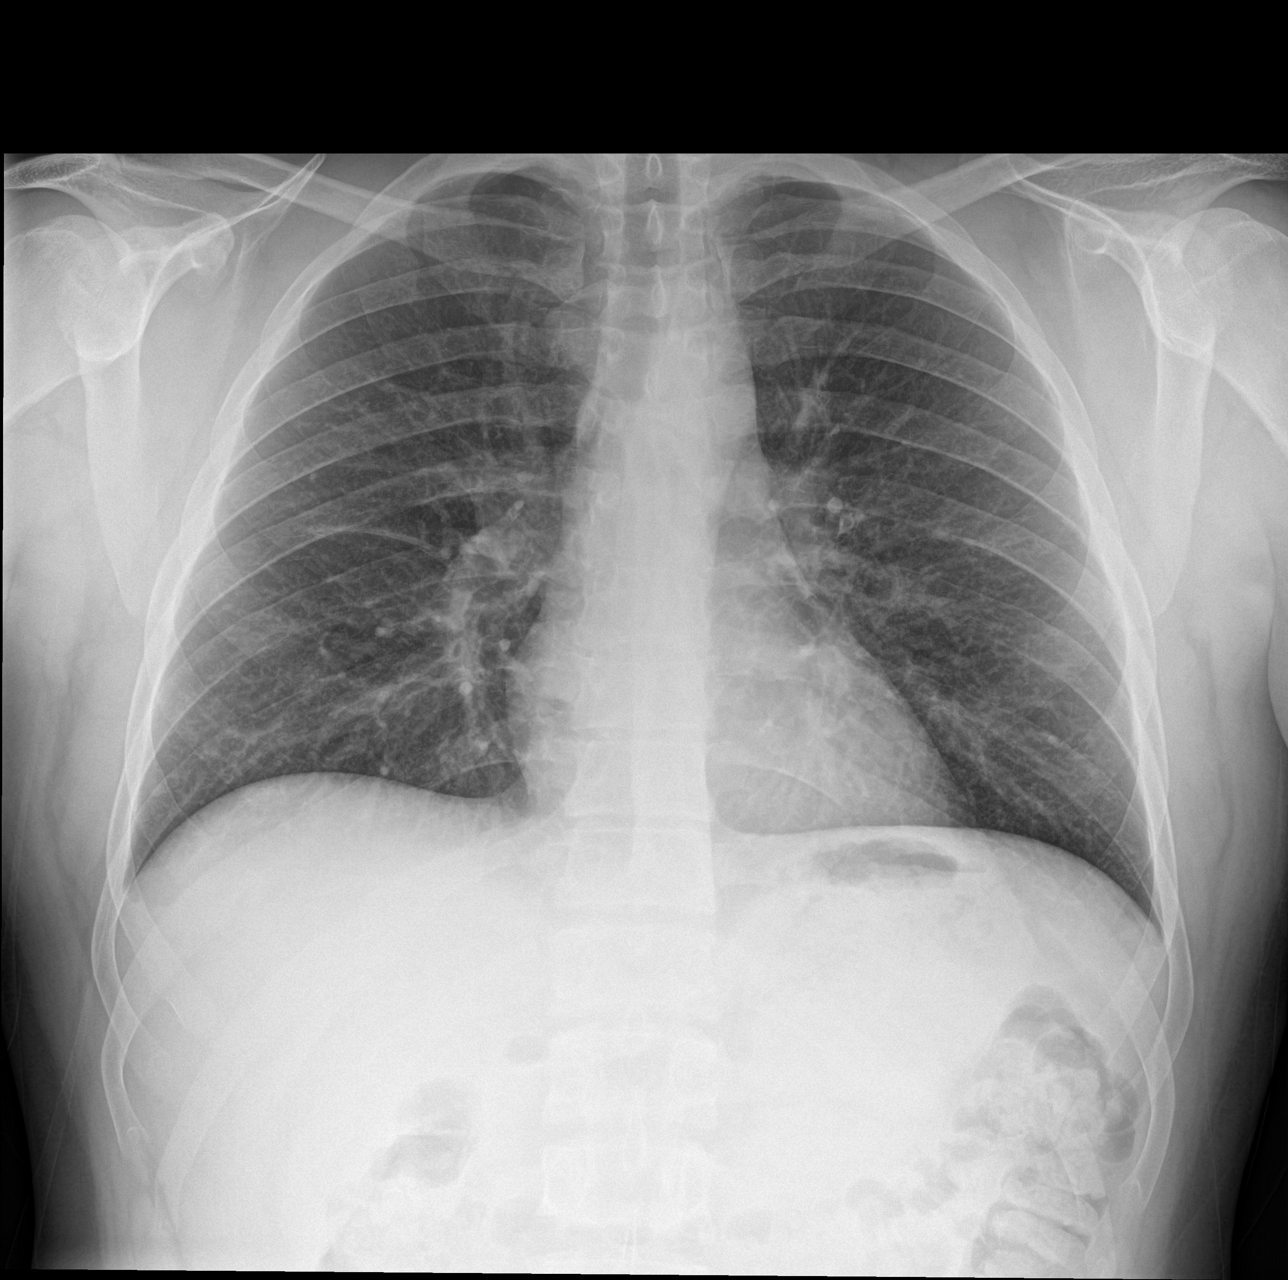

[Series 2: chest lat · 0.14mm/px · 2 of 2 slices shown]
[im 1/2]
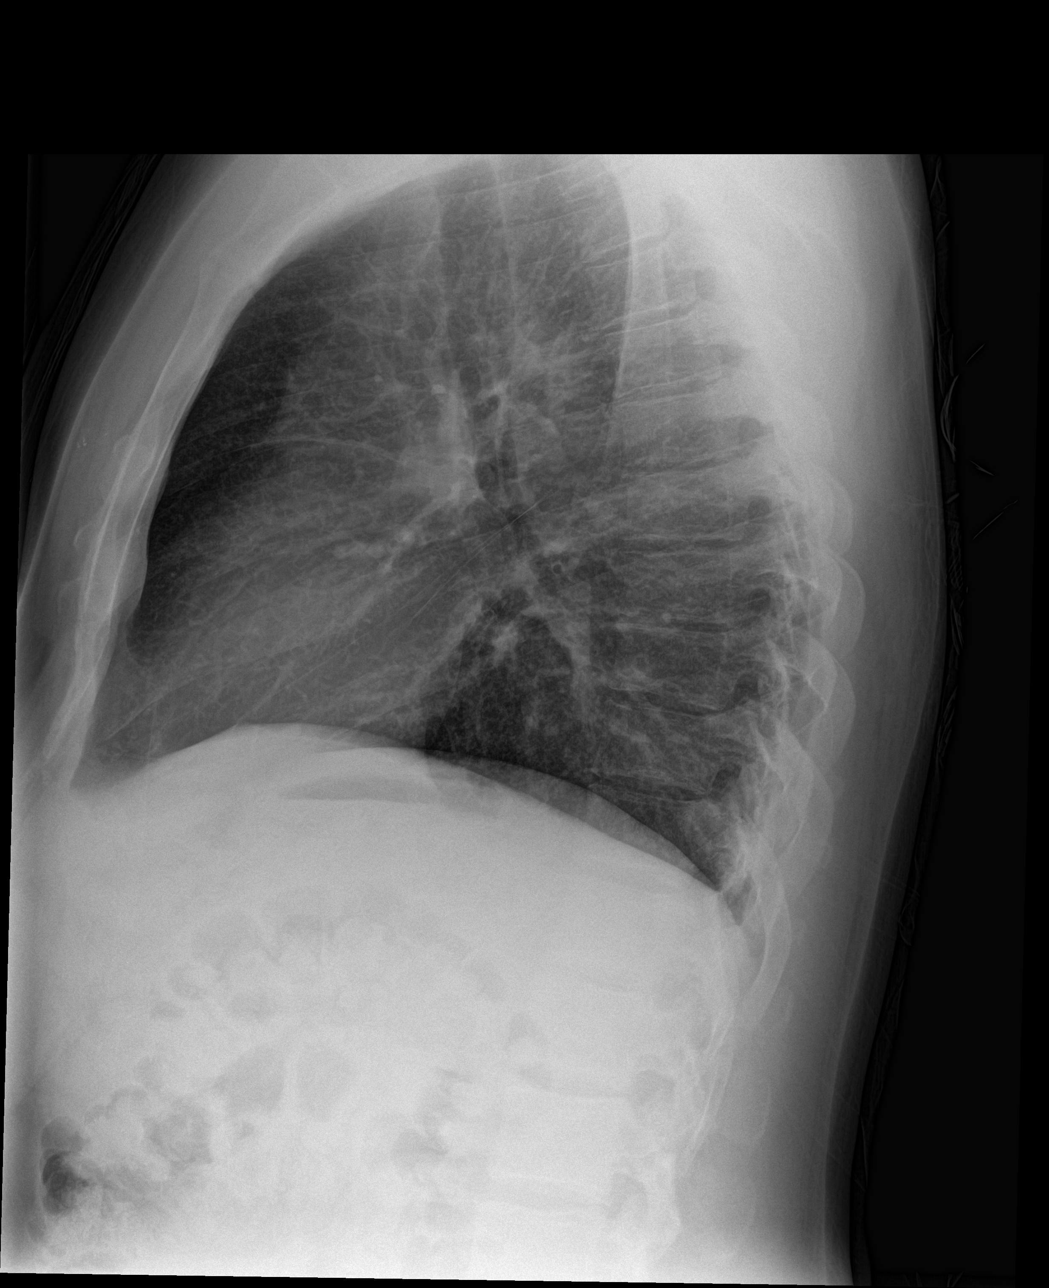
[im 2/2]
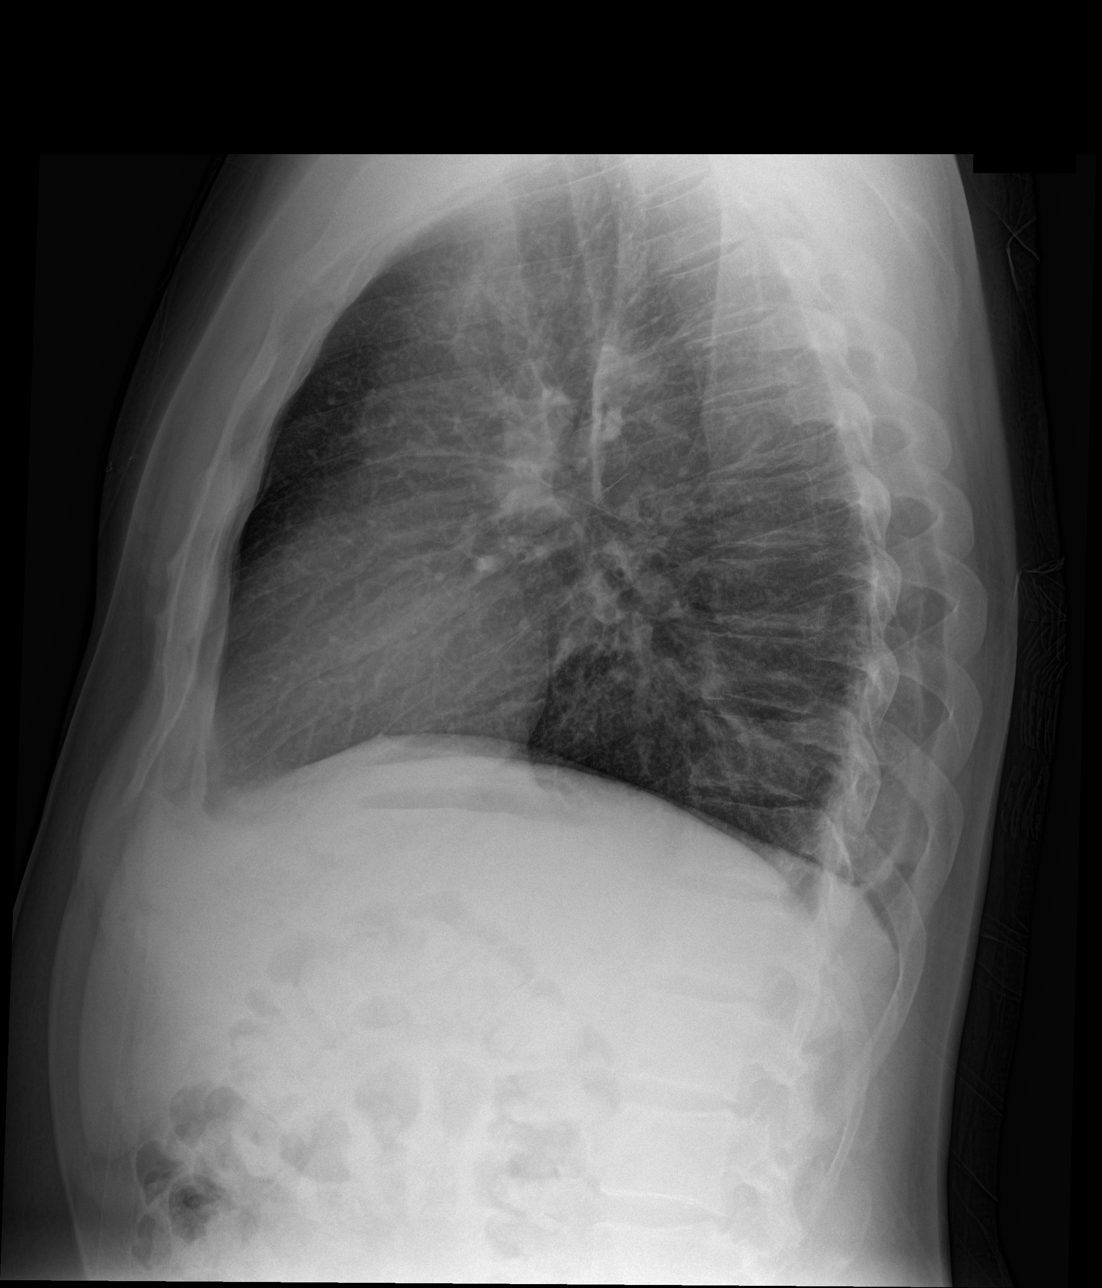

[3 of 3 positions shown; findings below may reference images not displayed]

FINDINGS: The heart size and mediastinal contours are within normal limits.
Both lungs are clear. The visualized skeletal structures are
unremarkable.
IMPRESSION: No active cardiopulmonary disease.

## 2018-07-06 ENCOUNTER — Emergency Department
Admission: EM | Admit: 2018-07-06 | Discharge: 2018-07-06 | Discharge status: Against Medical Advice | Payer: Self-pay | Attending: Emergency Medicine | Admitting: Emergency Medicine

## 2018-07-06 ENCOUNTER — Emergency Department: Payer: Self-pay

## 2018-07-06 ENCOUNTER — Encounter: Payer: Self-pay | Admitting: Emergency Medicine

## 2018-07-06 ENCOUNTER — Other Ambulatory Visit: Payer: Self-pay

## 2018-07-06 DIAGNOSIS — Z5321 Procedure and treatment not carried out due to patient leaving prior to being seen by health care provider: Secondary | ICD-10-CM | POA: Insufficient documentation

## 2018-07-06 DIAGNOSIS — R079 Chest pain, unspecified: Secondary | ICD-10-CM | POA: Insufficient documentation

## 2018-07-06 LAB — CBC
HCT: 41 % (ref 39.0–52.0)
Hemoglobin: 14.1 g/dL (ref 13.0–17.0)
MCH: 29.9 pg (ref 26.0–34.0)
MCHC: 34.4 g/dL (ref 30.0–36.0)
MCV: 87 fL (ref 80.0–100.0)
Platelets: 303 10*3/uL (ref 150–400)
RBC: 4.71 MIL/uL (ref 4.22–5.81)
RDW: 11.9 % (ref 11.5–15.5)
WBC: 7.6 10*3/uL (ref 4.0–10.5)
nRBC: 0 % (ref 0.0–0.2)

## 2018-07-06 LAB — BASIC METABOLIC PANEL
Anion gap: 8 (ref 5–15)
BUN: 22 mg/dL — AB (ref 6–20)
CO2: 24 mmol/L (ref 22–32)
Calcium: 9.1 mg/dL (ref 8.9–10.3)
Chloride: 106 mmol/L (ref 98–111)
Creatinine, Ser: 1.18 mg/dL (ref 0.61–1.24)
GFR calc Af Amer: >60 mL/min (ref >60)
GFR calc non Af Amer: >60 mL/min (ref >60)
Glucose, Bld: 96 mg/dL (ref 70–99)
POTASSIUM: 3.8 mmol/L (ref 3.5–5.1)
Sodium: 138 mmol/L (ref 135–145)

## 2018-07-06 LAB — TROPONIN I

## 2018-07-06 MED ORDER — ALUM & MAG HYDROXIDE-SIMETH 200-200-20 MG/5ML PO SUSP
30.0000 mL | Freq: Once | ORAL | Status: AC
  Administered 2018-07-06: 30 mL via ORAL
  Filled 2018-07-06: qty 30

## 2018-07-06 MED ORDER — LIDOCAINE VISCOUS HCL 2 % MT SOLN
15.0000 mL | Freq: Once | OROMUCOSAL | Status: AC
  Administered 2018-07-06: 15 mL via ORAL
  Filled 2018-07-06: qty 15

## 2018-07-06 NOTE — ED Notes (Signed)
Called for room and no answer 

## 2018-07-06 NOTE — ED Triage Notes (Signed)
Pt reports left side chest pain that radiates into his left neck. Pain started approximately 15 mins after having intercourse with his wife. Pt reports +shortness of breath and nausea with the pain.

## 2018-07-06 NOTE — ED Notes (Signed)
No answer when called for room 

## 2018-07-10 ENCOUNTER — Telehealth: Payer: Self-pay | Admitting: Emergency Medicine

## 2018-07-10 NOTE — Telephone Encounter (Signed)
Called patient due to lwot to inquire about condition and follow up plans.  No answer and no voicemail  

## 2018-11-23 ENCOUNTER — Emergency Department: Admission: EM | Admit: 2018-11-23 | Discharge: 2018-11-23 | Discharge status: Against Medical Advice | Payer: Self-pay

## 2018-11-23 NOTE — ED Notes (Signed)
Called for triage x 2 without answer 

## 2019-01-20 ENCOUNTER — Encounter: Payer: Self-pay | Admitting: Emergency Medicine

## 2019-01-20 ENCOUNTER — Other Ambulatory Visit: Payer: Self-pay

## 2019-01-20 ENCOUNTER — Emergency Department
Admission: EM | Admit: 2019-01-20 | Discharge: 2019-01-20 | Disposition: A | Discharge status: Home / Self Care | Payer: Self-pay | Attending: Emergency Medicine | Admitting: Emergency Medicine

## 2019-01-20 DIAGNOSIS — Z87891 Personal history of nicotine dependence: Secondary | ICD-10-CM | POA: Insufficient documentation

## 2019-01-20 DIAGNOSIS — L02412 Cutaneous abscess of left axilla: Secondary | ICD-10-CM | POA: Insufficient documentation

## 2019-01-20 DIAGNOSIS — L03114 Cellulitis of left upper limb: Secondary | ICD-10-CM | POA: Insufficient documentation

## 2019-01-20 DIAGNOSIS — L03113 Cellulitis of right upper limb: Secondary | ICD-10-CM | POA: Insufficient documentation

## 2019-01-20 DIAGNOSIS — L02415 Cutaneous abscess of right lower limb: Secondary | ICD-10-CM | POA: Insufficient documentation

## 2019-01-20 DIAGNOSIS — B958 Unspecified staphylococcus as the cause of diseases classified elsewhere: Secondary | ICD-10-CM | POA: Insufficient documentation

## 2019-01-20 DIAGNOSIS — L02413 Cutaneous abscess of right upper limb: Secondary | ICD-10-CM | POA: Insufficient documentation

## 2019-01-20 MED ORDER — CEPHALEXIN 500 MG PO CAPS: 500.0000 mg | ORAL_CAPSULE | Freq: Three times a day (TID) | ORAL | 0 refills | Status: DC

## 2019-01-20 MED ORDER — SULFAMETHOXAZOLE-TRIMETHOPRIM 800-160 MG PO TABS: 1.0000 | ORAL_TABLET | Freq: Two times a day (BID) | ORAL | 0 refills | Status: AC

## 2019-01-20 MED ORDER — CEFTRIAXONE SODIUM 1 G IJ SOLR
1.0000 g | Freq: Once | INTRAMUSCULAR | Status: AC
  Administered 2019-01-20: 1 g via INTRAMUSCULAR
  Filled 2019-01-20: qty 10

## 2019-01-20 MED ORDER — SULFAMETHOXAZOLE-TRIMETHOPRIM 800-160 MG PO TABS: 1.0000 | ORAL_TABLET | Freq: Two times a day (BID) | ORAL | 0 refills | Status: DC

## 2019-01-20 MED ORDER — CEPHALEXIN 500 MG PO CAPS: 500.0000 mg | ORAL_CAPSULE | Freq: Three times a day (TID) | ORAL | 0 refills | Status: AC

## 2019-01-20 NOTE — ED Provider Notes (Signed)
[facility] Emergency Department Provider Note  ____________________________________________  Time seen: Approximately 7:18 PM  I have reviewed the triage vital signs and the nursing notes.   HISTORY  Chief Complaint Abscess    HPI Patrick Zamora is a 33 y.o. male presents to the emergency department with multiple early abscesses and regions cellulitis over upper arms.  Patient states that he developed wounds after having recent tattoo work.  Patient has early abscess formation along right upper arm, left axilla and right lower leg.  Patient also has crusting and cellulitis and areas of tattoo along right upper and left upper arms.  Patient states that he has been more fatigued at home but denies fever and chills.  No nausea.  Patient denies a history of cutaneous abscesses or cellulitis.  No alleviating measures have been attempted.        Past Medical History:  Diagnosis Date  . Inguinal hernia     Patient Active Problem List   Diagnosis Date Noted  . Epididymitis, right 12/02/2015  . Inguinal hernia, right 12/02/2015  . Current tobacco use 11/30/2015    Past Surgical History:  Procedure Laterality Date  . undescended testicle      Prior to Admission medications   Medication Sig Start Date End Date Taking? Authorizing Provider  albuterol (PROVENTIL HFA;VENTOLIN HFA) 108 (90 Base) MCG/ACT inhaler Inhale into the lungs. Reported on 12/02/2015 05/29/15 05/28/16  [provider]  cephALEXin (KEFLEX) 500 MG capsule Take 1 capsule (500 mg total) by mouth 3 (three) times daily for 7 days. 01/20/19 01/27/19  Lannie Fields, PA-C  sulfamethoxazole-trimethoprim (BACTRIM DS) 800-160 MG tablet Take 1 tablet by mouth 2 (two) times daily for 7 days. 01/20/19 01/27/19  Lannie Fields, PA-C    Allergies Patient has no known allergies.  No family history on file.  Social History Social History   Tobacco Use  . Smoking status: Former Smoker    Packs/day: 0.50    Types: Cigarettes  . Smokeless tobacco: Never Used  Substance Use Topics  . Alcohol use: No  . Drug use: No     Review of Systems  Constitutional: No fever/chills Eyes: No visual changes. No discharge ENT: No upper respiratory complaints. Cardiovascular: no chest pain. Respiratory: no cough. No SOB. Gastrointestinal: No abdominal pain.  No nausea, no vomiting.  No diarrhea.  No constipation. Genitourinary: Negative for dysuria. No hematuria Musculoskeletal: Negative for musculoskeletal pain. Skin: Patient has multiple staph infections. Neurological: Negative for headaches, focal weakness or numbness.  ____________________________________________   PHYSICAL EXAM:  VITAL SIGNS: ED Triage Vitals  Enc Vitals Group     BP 01/20/19 1659 131/85     Pulse Rate 01/20/19 1659 90     Resp 01/20/19 1659 18     Temp 01/20/19 1659 98 F (36.7 C)     Temp Source 01/20/19 1659 Oral     SpO2 01/20/19 1659 98 %     Weight 01/20/19 1655 180 lb (81.6 kg)     Height 01/20/19 1655 5\' 7"  (1.702 m)     Head Circumference --      Peak Flow --      Pain Score 01/20/19 1655 9     Pain Loc --      Pain Edu? --      Excl. in [place]? --      Constitutional: Alert and oriented. Well appearing and in no acute distress. Eyes: Conjunctivae are normal. PERRL. EOMI. Head: Atraumatic. Cardiovascular: Normal rate,  regular rhythm. Normal S1 and S2.  Good peripheral circulation. Respiratory: Normal respiratory effort without tachypnea or retractions. Lungs CTAB. Good air entry to the bases with no decreased or absent breath sounds. Gastrointestinal: Bowel sounds 4 quadrants. Soft and nontender to palpation. No guarding or rigidity. No palpable masses. No distention. No CVA tenderness. Musculoskeletal: Full range of motion to all extremities. No gross deformities appreciated. Neurologic:  Normal speech and language. No gross focal neurologic deficits are appreciated.  Skin: Patient has  multiple early abscesses at right forearm, left axilla and right lower leg.  Affected regions not conducive to incision and drainage at this time.  Patient also has cellulitis along right and left upper arm tattoos with associated scab formation. Psychiatric: Mood and affect are normal. Speech and behavior are normal. Patient exhibits appropriate insight and judgement.   ____________________________________________   LABS (all labs ordered are listed, but only abnormal results are displayed)  Labs Reviewed - No data to display ____________________________________________  EKG   ____________________________________________  RADIOLOGY   No results found.  ____________________________________________    PROCEDURES  Procedure(s) performed:    Procedures    Medications  cefTRIAXone (ROCEPHIN) injection 1 g (has no administration in time range)     ____________________________________________   INITIAL IMPRESSION / ASSESSMENT AND PLAN / ED COURSE  Pertinent labs & imaging results that were available during my care of the patient were reviewed by me and considered in my medical decision making (see chart for details).  Review of the [place] CSRS was performed in accordance of the [facility] prior to dispensing any controlled drugs.        Assessment and plan Staph infections 33 year old male presents to the emergency department with evidence of staph infections along multiple areas of his body.  Patient has multiple early abscesses that are forming that are not conducive to incision and drainage at this time.  Patient was given IM Rocephin in the emergency department.  He was discharged with Bactrim and Keflex.  Patient was advised to take medications until completion.  He was given strict return precautions to return to the emergency department with new or worsening symptoms.  All patient questions were answered.    ____________________________________________  FINAL  CLINICAL IMPRESSION(S) / ED DIAGNOSES  Final diagnoses:  Staph infection      NEW MEDICATIONS STARTED DURING THIS VISIT:  ED Discharge Orders         Ordered    sulfamethoxazole-trimethoprim (BACTRIM DS) 800-160 MG tablet  2 times daily     01/20/19 1914    cephALEXin (KEFLEX) 500 MG capsule  3 times daily     01/20/19 1914              This chart was dictated using voice recognition software/Dragon. Despite best efforts to proofread, errors can occur which can change the meaning. Any change was purely unintentional.    Gasper LloydWoods, Mahogony Gilchrest M, PA-C 01/20/19 Vickii [id number]    Stafford, Phillip, MD 01/21/19 [phone]

## 2019-01-20 NOTE — ED Notes (Signed)
See triage note  Presents with possible abscess areas to left axilla area,right upper arm and also has an area to right leg

## 2019-01-20 NOTE — ED Triage Notes (Signed)
Sores to right upper arm tattoo, placed two weeks ago.  And left axilla abscesses x 10 days.

## 2019-01-20 NOTE — Discharge Instructions (Signed)
Return to the emergency department if affected areas worsen. Take Bactrim twice daily for the next week. Take Keflex 3 times daily for the next week.

## 2019-08-25 ENCOUNTER — Encounter (HOSPITAL_COMMUNITY): Payer: Self-pay | Admitting: Emergency Medicine

## 2019-08-25 ENCOUNTER — Other Ambulatory Visit: Payer: Self-pay

## 2019-08-25 ENCOUNTER — Emergency Department (HOSPITAL_COMMUNITY)
Admission: EM | Admit: 2019-08-25 | Discharge: 2019-08-25 | Disposition: A | Discharge status: Home / Self Care | Payer: Self-pay | Attending: Emergency Medicine | Admitting: Emergency Medicine

## 2019-08-25 DIAGNOSIS — F17210 Nicotine dependence, cigarettes, uncomplicated: Secondary | ICD-10-CM | POA: Insufficient documentation

## 2019-08-25 DIAGNOSIS — L02412 Cutaneous abscess of left axilla: Secondary | ICD-10-CM | POA: Insufficient documentation

## 2019-08-25 DIAGNOSIS — R030 Elevated blood-pressure reading, without diagnosis of hypertension: Secondary | ICD-10-CM | POA: Insufficient documentation

## 2019-08-25 MED ORDER — DOXYCYCLINE HYCLATE 100 MG PO CAPS: 100.0000 mg | ORAL_CAPSULE | Freq: Two times a day (BID) | ORAL | 0 refills | Status: AC

## 2019-08-25 NOTE — Discharge Instructions (Addendum)
You have been diagnosed today with Left Axillary Abscess.  At this time there does not appear to be the presence of an emergent medical condition, however there is always the potential for conditions to change. Please read and follow the below instructions.  Please return to the Emergency Department immediately for any new or worsening symptoms. Please be sure to follow up with your Primary Care Provider within one week regarding your visit today; please call their office to schedule an appointment even if you are feeling better for a follow-up visit. You may take the antibiotic doxycycline as prescribed to treat your infection.  Please continue to use warm soaks on the area multiple times a day to facilitate increased drainage.  Please refer to the guide attached to your discharge paperwork for further wound care instructions.  Please have the area rechecked in 2 days to ensure improvement, you may have the recheck performed at an urgent care, your primary care doctor's office or here at the emergency department. Additionally your blood pressure was slightly elevated in the ER today, please have your blood pressure rechecked by your primary care provider within 1 week and discuss medication management if indicated.  Get help right away if you: Get a very bad headache. Start to feel mixed up (confused). Feel weak or numb. Feel faint. Have very bad pain in your: Chest. Belly (abdomen). Throw up more than once. Have trouble breathing. You have very bad (severe) pain. You see red streaks on your skin spreading away from the abscess. You have any new/concerning or worsening of symptoms  Please read the additional information packets attached to your discharge summary.  Do not take your medicine if  develop an itchy rash, swelling in your mouth or lips, or difficulty breathing; call 911 and seek immediate emergency medical attention if this occurs.  Note: Portions of this text may have been  transcribed using voice recognition software. Every effort was made to ensure accuracy; however, inadvertent computerized transcription errors may still be present.

## 2019-08-25 NOTE — ED Provider Notes (Signed)
[place] EMERGENCY DEPARTMENT Provider Note   CSN: [id number] Arrival date & time: 08/25/19  1527     History Chief Complaint  Patient presents with  . Abscess    Patrick Zamora is a 34 y.o. male without history of immunocompromising disease presents today for concern of abscess of the left axilla.  He reports that 4 days ago he noticed a small bump to the area that he thought was a pimple or a bug bite.  He attempted to pop the area but it has continued to grow in size and become more painful.  He reports a moderate intensity throbbing pain to the area nonradiating worsened with touching and minimally improved with rest.  He reports that over the last 1-2 days the area has been draining a white pus like substance.  Denies fevers at home, denies chills, denies numbness/tingling, weakness, vomiting, immunocompromising diseases or additional concerns.  HPI     Past Medical History:  Diagnosis Date  . Inguinal hernia     Patient Active Problem List   Diagnosis Date Noted  . Epididymitis, right 12/02/2015  . Inguinal hernia, right 12/02/2015  . Current tobacco use 11/30/2015    Past Surgical History:  Procedure Laterality Date  . undescended testicle         No family history on file.  Social History   Tobacco Use  . Smoking status: Current Every Day Smoker    Packs/day: 0.50    Types: Cigarettes  . Smokeless tobacco: Never Used  Substance Use Topics  . Alcohol use: Yes    Comment: 4-6 beers per day  . Drug use: No    Home Medications Prior to Admission medications   Medication Sig Start Date End Date Taking? Authorizing Provider  albuterol (PROVENTIL HFA;VENTOLIN HFA) 108 (90 Base) MCG/ACT inhaler Inhale into the lungs. Reported on 12/02/2015 05/29/15 05/28/16  [provider]  doxycycline (VIBRAMYCIN) 100 MG capsule Take 1 capsule (100 mg total) by mouth 2 (two) times daily for 7 days. 08/25/19 09/01/19  Deliah Boston,  PA-C    Allergies    Patient has no known allergies.  Review of Systems   Review of Systems  Constitutional: Negative.  Negative for chills and fever.  Gastrointestinal: Negative.  Negative for diarrhea and vomiting.  Musculoskeletal: Negative.  Negative for arthralgias and myalgias.  Skin: Positive for wound (Abscess).  Neurological: Negative.  Negative for weakness and numbness.    Physical Exam Updated Vital Signs BP (!) 143/93 (BP Location: Right Arm)   Pulse (!) 101   Temp 98.2 F (36.8 C) (Oral)   Resp 16   Ht 5\' 7"  (1.702 m)   Wt 86.2 kg   SpO2 95%   BMI 29.76 kg/m   Physical Exam Constitutional:      General: He is not in acute distress.    Appearance: Normal appearance. He is well-developed. He is not ill-appearing or diaphoretic.  HENT:     Head: Normocephalic and atraumatic.     Right Ear: External ear normal.     Left Ear: External ear normal.     Nose: Nose normal.  Eyes:     General: Vision grossly intact. Gaze aligned appropriately.     Pupils: Pupils are equal, round, and reactive to light.  Neck:     Trachea: Trachea and phonation normal. No tracheal deviation.  Cardiovascular:     Rate and Rhythm: Normal rate and regular rhythm.     Pulses:  Radial pulses are 2+ on the right side and 2+ on the left side.     Comments: Palpated pulse approximately 86 bpm. Pulmonary:     Effort: Pulmonary effort is normal. No respiratory distress.  Abdominal:     General: There is no distension.     Palpations: Abdomen is soft.     Tenderness: There is no abdominal tenderness. There is no guarding or rebound.  Musculoskeletal:        General: Normal range of motion.     Cervical back: Normal range of motion.  Skin:    General: Skin is warm and dry.          Comments: Approximately 1.5 cm in diameter area of induration and fluctuance consistent with abscess.  Approximately 7 mm in diameter hole centrally with white drainage.  Minimal surrounding  erythema.  Neurological:     Mental Status: He is alert.     GCS: GCS eye subscore is 4. GCS verbal subscore is 5. GCS motor subscore is 6.     Comments: Speech is clear and goal oriented, follows commands Major Cranial nerves without deficit, no facial droop Moves extremities without ataxia, coordination intact  Psychiatric:        Behavior: Behavior normal.     ED Results / Procedures / Treatments   Labs (all labs ordered are listed, but only abnormal results are displayed) Labs Reviewed - No data to display  EKG None  Radiology No results found.  Procedures Procedures (including critical care time)  Medications Ordered in ED Medications - No data to display  ED Course  I have reviewed the triage vital signs and the nursing notes.  Pertinent labs & imaging results that were available during my care of the patient were reviewed by me and considered in my medical decision making (see chart for details).    MDM Rules/Calculators/A&P                     34 year old male without history of immunocompromising disease presents today for an abscess of his left axilla that began 4 days ago.  It is freely draining on examination and has minimal surrounding erythema.  He has no symptoms to suggest systemic infection, he is well-appearing and in no acute distress.  He is neurovascular intact to the upper extremities.  He has no other concerns today.  As the area is freely draining there is no indication for incision at this time, the area is large enough that incision would not be beneficial.  We will start patient on doxycycline for increased MRSA coverage, encourage warm compresses and have him return for recheck in 2 days.  Of note patient had elevated blood pressure reading here in the emergency department, improved on retake.  He is asymptomatic regarding elevated blood pressure reading, advised him to follow-up with PCP for recheck and discuss medication management if indicated  within 1 week.  Informed of signs/symptoms of hypertensive urgency/emergency and to return to the ER if they occur.  Additionally patient noted to have mildly elevated heart rate in the emergency department, during my earlier evaluation palpation of the radial pulse was not tachycardic.  He reports that he drank 2 Redbulls prior to arrival.  On reevaluation patient well-appearing no acute distress, nontoxic and he has no complaints or concerns at this time, no indication for blood work or further ED work-up at this time.  At this time there does not appear to be any evidence of  an acute emergency medical condition and the patient appears stable for discharge with appropriate outpatient follow up. Diagnosis was discussed with patient who verbalizes understanding of care plan and is agreeable to discharge. I have discussed return precautions with patient who verbalizes understanding of return precautions. Patient encouraged to follow-up with their PCP. All questions answered.  Note: Portions of this report may have been transcribed using voice recognition software. Every effort was made to ensure accuracy; however, inadvertent computerized transcription errors may still be present. Final Clinical Impression(s) / ED Diagnoses Final diagnoses:  Abscess of axilla, left  Elevated blood pressure reading    Rx / DC Orders ED Discharge Orders         Ordered    doxycycline (VIBRAMYCIN) 100 MG capsule  2 times daily     08/25/19 1723           Elizabeth Palau 08/25/19 1728    Melene Plan, DO 08/25/19 1935

## 2019-08-25 NOTE — ED Notes (Signed)
Patient verbalizes understanding of discharge instructions. Opportunity for questioning and answers were provided. Pt discharged from ED. 

## 2019-08-25 NOTE — ED Triage Notes (Signed)
Pt in POV. Presents with abscess beneath L axilla. Pt states he believes he was bit by a spider on Friday. There was initially a small "bump" that looked like a pimple initially and has continued to grow in size. Has been draining since yesterday. Afebrile.

## 2023-12-02 ENCOUNTER — Emergency Department (HOSPITAL_COMMUNITY)

## 2023-12-02 ENCOUNTER — Emergency Department (HOSPITAL_COMMUNITY)
Admission: EM | Admit: 2023-12-02 | Discharge: 2023-12-02 | Disposition: A | Discharge status: Against Medical Advice | Attending: Emergency Medicine | Admitting: Emergency Medicine

## 2023-12-02 ENCOUNTER — Other Ambulatory Visit: Payer: Self-pay

## 2023-12-02 ENCOUNTER — Encounter (HOSPITAL_COMMUNITY): Payer: Self-pay | Admitting: Emergency Medicine

## 2023-12-02 DIAGNOSIS — R0602 Shortness of breath: Secondary | ICD-10-CM | POA: Insufficient documentation

## 2023-12-02 DIAGNOSIS — H9203 Otalgia, bilateral: Secondary | ICD-10-CM | POA: Insufficient documentation

## 2023-12-02 DIAGNOSIS — R072 Precordial pain: Secondary | ICD-10-CM | POA: Insufficient documentation

## 2023-12-02 DIAGNOSIS — Z5321 Procedure and treatment not carried out due to patient leaving prior to being seen by health care provider: Secondary | ICD-10-CM | POA: Insufficient documentation

## 2023-12-02 DIAGNOSIS — J029 Acute pharyngitis, unspecified: Secondary | ICD-10-CM | POA: Diagnosis present

## 2023-12-02 LAB — BASIC METABOLIC PANEL WITH GFR
Anion gap: 10 (ref 5–15)
BUN: 10 mg/dL (ref 6–20)
CO2: 23 mmol/L (ref 22–32)
Calcium: 9.3 mg/dL (ref 8.9–10.3)
Chloride: 105 mmol/L (ref 98–111)
Creatinine, Ser: 0.91 mg/dL (ref 0.61–1.24)
GFR, Estimated: >60 mL/min (ref >60)
Glucose, Bld: 102 mg/dL — ABNORMAL HIGH (ref 70–99)
Potassium: 3.6 mmol/L (ref 3.5–5.1)
Sodium: 138 mmol/L (ref 135–145)

## 2023-12-02 LAB — CBC
HCT: 38 % — ABNORMAL LOW (ref 39.0–52.0)
Hemoglobin: 13.4 g/dL (ref 13.0–17.0)
MCH: 30.7 pg (ref 26.0–34.0)
MCHC: 35.3 g/dL (ref 30.0–36.0)
MCV: 87.2 fL (ref 80.0–100.0)
Platelets: 290 10*3/uL (ref 150–400)
RBC: 4.36 MIL/uL (ref 4.22–5.81)
RDW: 13 % (ref 11.5–15.5)
WBC: 4.9 10*3/uL (ref 4.0–10.5)
nRBC: 0 % (ref 0.0–0.2)

## 2023-12-02 LAB — TROPONIN I (HIGH SENSITIVITY): Troponin I (High Sensitivity): 3 ng/L (ref <18)

## 2023-12-02 NOTE — ED Provider Notes (Signed)
 Per nursing staff, patient was put in a room, within 5 minutes he stepped out.  When nurses asked him if everything was fine, he said he just wanted to take a smoke break and significant other states that no they are leaving.  Patient eloped.  I never had a chance to see them.   Deatra Face, MD 12/02/23 [phone]

## 2023-12-02 NOTE — ED Triage Notes (Signed)
 PT complains of bilateral ear pain and  mid sternal chest pain x 1 week with SOB. Sore throat for 6 months. no nausea or vomiting.

## 2023-12-02 NOTE — ED Notes (Signed)
 Patient left without being seen.
